# Patient Record
Sex: Male | Born: 1960 | Race: White | Hispanic: No | Marital: Single | Smoking: Current every day smoker
Health system: Southern US, Community
[De-identification: ages and names within clinical notes are randomized; demographics above are authoritative.]

## PROBLEM LIST (undated history)

## (undated) ENCOUNTER — Emergency Department (HOSPITAL_COMMUNITY): Discharge status: Absent without leave | Payer: Medicaid Other

## (undated) DIAGNOSIS — J45909 Unspecified asthma, uncomplicated: Secondary | ICD-10-CM

## (undated) DIAGNOSIS — I10 Essential (primary) hypertension: Secondary | ICD-10-CM

## (undated) DIAGNOSIS — K7460 Unspecified cirrhosis of liver: Secondary | ICD-10-CM

---

## 2020-09-08 ENCOUNTER — Other Ambulatory Visit: Payer: Self-pay

## 2020-09-08 ENCOUNTER — Encounter (HOSPITAL_COMMUNITY): Payer: Self-pay

## 2020-09-08 ENCOUNTER — Ambulatory Visit (HOSPITAL_COMMUNITY)
Admission: EM | Admit: 2020-09-08 | Discharge: 2020-09-08 | Disposition: A | Discharge status: Home / Self Care | Payer: BLUE CROSS/BLUE SHIELD

## 2020-09-08 DIAGNOSIS — Z760 Encounter for issue of repeat prescription: Secondary | ICD-10-CM | POA: Diagnosis not present

## 2020-09-08 HISTORY — DX: Unspecified asthma, uncomplicated: J45.909

## 2020-09-08 HISTORY — DX: Essential (primary) hypertension: I10

## 2020-09-08 MED ORDER — LACTULOSE 10 GM/15ML PO SOLN: 30.0000 g | Freq: Three times a day (TID) | ORAL | 0 refills | Status: AC

## 2020-09-08 MED ORDER — XIFAXAN 550 MG PO TABS: 550.0000 mg | ORAL_TABLET | Freq: Two times a day (BID) | ORAL | 0 refills | Status: AC

## 2020-09-08 NOTE — ED Triage Notes (Addendum)
Pt is traveling from [place] and will be here until first of the year. Pt needs a medication refill on Lactulose and rifaximin.  Pt states that because he has not had these medication he starting to have slurred speech and equilibrium is off.

## 2020-09-08 NOTE — Discharge Instructions (Signed)
As discussed, we did not evaluate for anything else per your preference. Lactulose and xifaxan was refilled for 3 days. Please continue medicine and call your PCP on Monday for further evaluation. If worsening symptoms, go to the emergency department for further evaluation.

## 2020-09-08 NOTE — ED Provider Notes (Signed)
[facility]    CSN: [id number] Arrival date & time: 09/08/20  1525      History   Chief Complaint Chief Complaint  Patient presents with   Medication Refill    Refills need  Lactulose solution and rifaximin      HPI Angel Webster is a 59 y.o. male.   59 year old male comes in for medication refill. States he is from [place], and ran out of his lactulose and rifaximin today. States has slurred speech and equilibrium is off and knows this is how he feels when he isn't taking his medicine. Denies one sided weakness.     Past Medical History:  Diagnosis Date   Asthma    Hypertension     There are no problems to display for this patient.   History reviewed. No pertinent surgical history.     Home Medications    Prior to Admission medications   Medication Sig Start Date End Date Taking? Authorizing Provider  albuterol (VENTOLIN HFA) 108 (90 Base) MCG/ACT inhaler SMARTSIG:2 Puff(s) By Mouth Every 4-6 Hours PRN 07/04/20   [provider]  carvedilol (COREG) 3.125 MG tablet Take 3.125 mg by mouth 2 (two) times daily. 08/30/20   [provider]  CONTOUR NEXT TEST test strip SMARTSIG:Via Meter 07/17/20   [provider]  cyclobenzaprine (FLEXERIL) 10 MG tablet Take by mouth. 04/28/20   [provider]  folic acid (FOLVITE) 1 MG tablet Take 1 mg by mouth daily. 07/01/20   [provider]  furosemide (LASIX) 40 MG tablet Take 40 mg by mouth 2 (two) times daily. 07/04/20   [provider]  ibuprofen (ADVIL) 600 MG tablet Take by mouth. 04/28/20   [provider]  lactulose (CHRONULAC) 10 GM/15ML solution Take 45 mLs (30 g total) by mouth 3 (three) times daily for 3 days. 09/08/20 09/11/20  Tasia Catchings, Maryjean Corpening V, PA-C  lidocaine (LIDODERM) 5 % 1 patch daily. 04/28/20   [provider]  NARCAN 4 MG/0.1ML LIQD nasal spray kit SMARTSIG:1 Spray(s) Both Nares Once PRN 07/26/20   [provider]   pantoprazole (PROTONIX) 40 MG tablet Take 40 mg by mouth 2 (two) times daily. 08/30/20   [provider]  spironolactone (ALDACTONE) 100 MG tablet Take 100 mg by mouth daily. 08/30/20   [provider]  XIFAXAN 550 MG TABS tablet Take 1 tablet (550 mg total) by mouth 2 (two) times daily for 3 days. 09/08/20 09/11/20  Ok Edwards, PA-C    Family History Family History  Family history unknown: Yes    Social History Social History   Tobacco Use   Smoking status: Current Every Day Smoker   Smokeless tobacco: Current User     Allergies   Ace inhibitors   Review of Systems Review of Systems  Reason unable to perform ROS: See HPI as above.     Physical Exam Triage Vital Signs ED Triage Vitals  Enc Vitals Group     BP 09/08/20 1712 110/70     Pulse Rate 09/08/20 1712 74     Resp 09/08/20 1712 18     Temp 09/08/20 1712 97.9 F (36.6 C)     Temp Source 09/08/20 1712 Oral     SpO2 09/08/20 1712 100 %     Weight --      Height --      Head Circumference --      Peak Flow --      Pain Score 09/08/20 1711  0     Pain Loc --      Pain Edu? --      Excl. in [place]? --    No data found.  Updated Vital Signs BP 110/70 (BP Location: Right Arm)    Pulse 74    Temp 97.9 F (36.6 C) (Oral)    Resp 18    SpO2 100%   Visual Acuity Right Eye Distance:   Left Eye Distance:   Bilateral Distance:    Right Eye Near:   Left Eye Near:    Bilateral Near:     Physical Exam Constitutional:      General: He is not in acute distress.    Appearance: Normal appearance. He is well-developed. He is not toxic-appearing or diaphoretic.  HENT:     Head: Normocephalic and atraumatic.  Eyes:     Conjunctiva/sclera: Conjunctivae normal.     Pupils: Pupils are equal, round, and reactive to light.  Pulmonary:     Effort: Pulmonary effort is normal. No respiratory distress.  Musculoskeletal:     Cervical back: Normal range of motion and neck supple.  Skin:    General: Skin  is warm and dry.  Neurological:     Mental Status: He is alert and oriented to person, place, and time.     Comments: No obvious focal deficits. Strength 5/5 bilaterally. Able to ambulate on own without difficulty.      UC Treatments / Results  Labs (all labs ordered are listed, but only abnormal results are displayed) Labs Reviewed - No data to display  EKG   Radiology No results found.  Procedures Procedures (including critical care time)  Medications Ordered in UC Medications - No data to display  Initial Impression / Assessment and Plan / UC Course  I have reviewed the triage vital signs and the nursing notes.  Pertinent labs & imaging results that were available during my care of the patient were reviewed by me and considered in my medical decision making (see chart for details).    Discussed worries of symptoms and requiring further evaluation. Patient states he did not want further evaluation and declined higher level of care. States he just wants refill of medicine until weekday when he can call his PCP. He expressed understanding that there may be acute processes that can be life threatening, and would just like refill of medicine. Medicines refilled for 3 days, to call PCP for further management. To proceed to ED if symptoms worsening.  Final Clinical Impressions(s) / UC Diagnoses   Final diagnoses:  Encounter for medication refill   ED Prescriptions    Medication Sig Dispense Auth. Provider   XIFAXAN 550 MG TABS tablet Take 1 tablet (550 mg total) by mouth 2 (two) times daily for 3 days. 6 tablet Acheron Sugg V, PA-C   lactulose (CHRONULAC) 10 GM/15ML solution Take 45 mLs (30 g total) by mouth 3 (three) times daily for 3 days. 405 mL Ok Edwards, PA-C     PDMP not reviewed this encounter.   Ok Edwards, PA-C 09/08/20 1938

## 2020-09-15 ENCOUNTER — Ambulatory Visit (HOSPITAL_COMMUNITY)
Admission: EM | Admit: 2020-09-15 | Discharge: 2020-09-15 | Disposition: A | Discharge status: Home / Self Care | Payer: BLUE CROSS/BLUE SHIELD | Attending: Family Medicine | Admitting: Family Medicine

## 2020-09-15 ENCOUNTER — Encounter (HOSPITAL_COMMUNITY): Payer: Self-pay | Admitting: *Deleted

## 2020-09-15 ENCOUNTER — Other Ambulatory Visit: Payer: Self-pay

## 2020-09-15 DIAGNOSIS — K769 Liver disease, unspecified: Secondary | ICD-10-CM

## 2020-09-15 MED ORDER — LACTULOSE 10 GM/15ML PO SOLN: 30.0000 g | Freq: Three times a day (TID) | ORAL | 0 refills | Status: AC

## 2020-09-15 NOTE — ED Provider Notes (Signed)
[facility]    CSN: [id number] Arrival date & time: 09/15/20  1001      History   Chief Complaint Chief Complaint  Patient presents with   Medication Refill    Pt needs Lactulose  refill     HPI Angel Webster is a 59 y.o. male.   Here today requesting refill on his lactulose. States he lives in [place] and has been having a hard time getting his medication filled down here while he's visiting. Just ran out this morning, has been compliant and tolerating it well. Denies altered mental status, weakness, falls, abdominal pain, diarrhea, or other issues currently. Last labs per patient were last month back in [place] and per his report they were stable.      Past Medical History:  Diagnosis Date   Asthma    Hypertension     There are no problems to display for this patient.   History reviewed. No pertinent surgical history.     Home Medications    Prior to Admission medications   Medication Sig Start Date End Date Taking? Authorizing Provider  albuterol (VENTOLIN HFA) 108 (90 Base) MCG/ACT inhaler SMARTSIG:2 Puff(s) By Mouth Every 4-6 Hours PRN 07/04/20   [provider]  carvedilol (COREG) 3.125 MG tablet Take 3.125 mg by mouth 2 (two) times daily. 08/30/20   [provider]  CONTOUR NEXT TEST test strip SMARTSIG:Via Meter 07/17/20   [provider]  cyclobenzaprine (FLEXERIL) 10 MG tablet Take by mouth. 04/28/20   [provider]  folic acid (FOLVITE) 1 MG tablet Take 1 mg by mouth daily. 07/01/20   [provider]  furosemide (LASIX) 40 MG tablet Take 40 mg by mouth 2 (two) times daily. 07/04/20   [provider]  ibuprofen (ADVIL) 600 MG tablet Take by mouth. 04/28/20   [provider]  lactulose (CHRONULAC) 10 GM/15ML solution Take 45 mLs (30 g total) by mouth 3 (three) times daily for 3 days. 09/15/20 09/18/20  Particia Nearing, PA-C  lidocaine (LIDODERM) 5 % 1 patch daily. 04/28/20    [provider]  NARCAN 4 MG/0.1ML LIQD nasal spray kit SMARTSIG:1 Spray(s) Both Nares Once PRN 07/26/20   [provider]  pantoprazole (PROTONIX) 40 MG tablet Take 40 mg by mouth 2 (two) times daily. 08/30/20   [provider]  spironolactone (ALDACTONE) 100 MG tablet Take 100 mg by mouth daily. 08/30/20   [provider]    Family History Family History  Family history unknown: Yes    Social History Social History   Tobacco Use   Smoking status: Current Every Day Smoker   Smokeless tobacco: Current User  Substance Use Topics   Alcohol use: Not Currently   Drug use: Not Currently     Allergies   Ace inhibitors   Review of Systems Review of Systems PER HPI    Physical Exam Triage Vital Signs ED Triage Vitals  Enc Vitals Group     BP 09/15/20 1012 127/90     Pulse Rate 09/15/20 1012 79     Resp 09/15/20 1012 16     Temp 09/15/20 1012 97.8 F (36.6 C)     Temp Source 09/15/20 1012 Oral     SpO2 09/15/20 1012 100 %     Weight --      Height --      Head Circumference --      Peak Flow --      Pain Score 09/15/20 1014 0  Pain Loc --      Pain Edu? --      Excl. in [place]? --    No data found.  Updated Vital Signs BP 127/90 (BP Location: Right Arm)    Pulse 79    Temp 97.8 F (36.6 C) (Oral)    Resp 16    SpO2 100%   Visual Acuity Right Eye Distance:   Left Eye Distance:   Bilateral Distance:    Right Eye Near:   Left Eye Near:    Bilateral Near:     Physical Exam Vitals and nursing note reviewed.  Constitutional:      Appearance: Normal appearance.  HENT:     Head: Atraumatic.     Right Ear: Tympanic membrane normal.     Left Ear: Tympanic membrane normal.     Nose: Nose normal.     Mouth/Throat:     Mouth: Mucous membranes are moist.     Pharynx: Oropharynx is clear.  Eyes:     Extraocular Movements: Extraocular movements intact.     Conjunctiva/sclera: Conjunctivae normal.  Cardiovascular:     Rate  and Rhythm: Normal rate and regular rhythm.     Heart sounds: Normal heart sounds.  Pulmonary:     Effort: Pulmonary effort is normal.     Breath sounds: Normal breath sounds.  Abdominal:     General: Bowel sounds are normal. There is no distension.     Palpations: Abdomen is soft.     Tenderness: There is no abdominal tenderness. There is no guarding.  Musculoskeletal:        General: Normal range of motion.     Cervical back: Normal range of motion and neck supple.  Skin:    General: Skin is warm and dry.  Neurological:     General: No focal deficit present.     Mental Status: He is oriented to person, place, and time.  Psychiatric:        Mood and Affect: Mood normal.        Thought Content: Thought content normal.        Judgment: Judgment normal.      UC Treatments / Results  Labs (all labs ordered are listed, but only abnormal results are displayed) Labs Reviewed - No data to display  EKG   Radiology No results found.  Procedures Procedures (including critical care time)  Medications Ordered in UC Medications - No data to display  Initial Impression / Assessment and Plan / UC Course  I have reviewed the triage vital signs and the nursing notes.  Pertinent labs & imaging results that were available during my care of the patient were reviewed by me and considered in my medical decision making (see chart for details).     Declines labs today, as he states PCP just did them last month. Will refill x 3 days per his request as his home medicines should be here Tuesday. Discussed re-evaluation if having any sxs in meantime.   Final Clinical Impressions(s) / UC Diagnoses   Final diagnoses:  Chronic liver disease   Discharge Instructions   None    ED Prescriptions    Medication Sig Dispense Auth. Provider   lactulose (CHRONULAC) 10 GM/15ML solution Take 45 mLs (30 g total) by mouth 3 (three) times daily for 3 days. 405 mL Volney American, [place]      PDMP not reviewed this encounter.   Volney American, [place] 09/15/20 1047

## 2020-09-15 NOTE — ED Triage Notes (Signed)
Pt needs a refill on lactulose

## 2020-10-24 ENCOUNTER — Encounter (INDEPENDENT_AMBULATORY_CARE_PROVIDER_SITE_OTHER): Payer: Self-pay | Admitting: Primary Care

## 2020-10-24 ENCOUNTER — Other Ambulatory Visit: Payer: Self-pay

## 2020-10-24 ENCOUNTER — Ambulatory Visit (INDEPENDENT_AMBULATORY_CARE_PROVIDER_SITE_OTHER): Payer: Medicaid Other | Admitting: Primary Care

## 2020-10-24 VITALS — BP 122/85 | HR 95 | Temp 97.5°F | Ht 69.0 in | Wt 323.2 lb

## 2020-10-24 DIAGNOSIS — R601 Generalized edema: Secondary | ICD-10-CM | POA: Diagnosis not present

## 2020-10-24 DIAGNOSIS — K7030 Alcoholic cirrhosis of liver without ascites: Secondary | ICD-10-CM | POA: Diagnosis not present

## 2020-10-24 DIAGNOSIS — Z125 Encounter for screening for malignant neoplasm of prostate: Secondary | ICD-10-CM

## 2020-10-24 DIAGNOSIS — Z7689 Persons encountering health services in other specified circumstances: Secondary | ICD-10-CM | POA: Diagnosis not present

## 2020-10-24 DIAGNOSIS — I10 Essential (primary) hypertension: Secondary | ICD-10-CM | POA: Diagnosis not present

## 2020-10-24 DIAGNOSIS — K219 Gastro-esophageal reflux disease without esophagitis: Secondary | ICD-10-CM

## 2020-10-24 DIAGNOSIS — Z13220 Encounter for screening for lipoid disorders: Secondary | ICD-10-CM

## 2020-10-24 DIAGNOSIS — L80 Vitiligo: Secondary | ICD-10-CM

## 2020-10-24 MED ORDER — XIFAXAN 550 MG PO TABS: 550.0000 mg | ORAL_TABLET | Freq: Two times a day (BID) | ORAL | 1 refills | Status: DC

## 2020-10-24 MED ORDER — FUROSEMIDE 40 MG PO TABS: 40.0000 mg | ORAL_TABLET | Freq: Two times a day (BID) | ORAL | 1 refills | Status: DC

## 2020-10-24 MED ORDER — CARVEDILOL 3.125 MG PO TABS: 3.1250 mg | ORAL_TABLET | Freq: Two times a day (BID) | ORAL | 1 refills | Status: DC

## 2020-10-24 MED ORDER — SPIRONOLACTONE 100 MG PO TABS: 100.0000 mg | ORAL_TABLET | Freq: Every day | ORAL | 1 refills | Status: DC

## 2020-10-24 MED ORDER — PANTOPRAZOLE SODIUM 40 MG PO TBEC
40.0000 mg | DELAYED_RELEASE_TABLET | Freq: Two times a day (BID) | ORAL | 1 refills | Status: DC
Start: 2020-10-24 — End: 2020-11-14

## 2020-10-24 MED ORDER — LACTULOSE 10 GM/15ML PO SOLN: ORAL | 3 refills | Status: DC

## 2020-10-24 MED ORDER — ZINC SULFATE 220 (50 ZN) MG PO CAPS: 220.0000 mg | ORAL_CAPSULE | Freq: Every day | ORAL | 1 refills | Status: DC

## 2020-10-24 NOTE — Progress Notes (Signed)
New Patient Office Visit  Subjective:  Patient ID: Angel Webster, male    DOB: 1960/11/28  Age: 60 y.o. MRN: [id number]  CC:  Chief Complaint  Patient presents with  . New Patient (Initial Visit)    HPI [name] is a 60 year male who presents for 2 Urgent care visit  09/08/20-refills need Lactulose solution and rifaximin return to urgent care on 09/15/20 Lactulose solution and rifaximin. Establishing care with new provider.    Past Medical History:  Diagnosis Date  . Asthma   . Hypertension     No past surgical history on file.  Family History  Family history unknown: Yes    Social History   Socioeconomic History  . Marital status: Single    Spouse name: Not on file  . Number of children: Not on file  . Years of education: Not on file  . Highest education level: Not on file  Occupational History  . Not on file  Tobacco Use  . Smoking status: Current Every Day Smoker  . Smokeless tobacco: Current User  Substance and Sexual Activity  . Alcohol use: Not Currently  . Drug use: Not Currently  . Sexual activity: Not Currently  Other Topics Concern  . Not on file  Social History Narrative  . Not on file   Social Determinants of Health   Financial Resource Strain: Not on file  Food Insecurity: Not on file  Transportation Needs: Not on file  Physical Activity: Not on file  Stress: Not on file  Social Connections: Not on file  Intimate Partner Violence: Not on file    ROS Review of Systems  Skin:       vitiligo  Red areas appeared 1 week ago under eyes , outline of beard and under bottom lip- flaky   All other systems reviewed and are negative.   Objective:   Today's Vitals: BP 122/85 (BP Location: Right Arm, Patient Position: Sitting, Cuff Size: Large)   Pulse 95   Temp (!) 97.5 F (36.4 C) (Temporal)   Ht _0  (1.753 m)   Wt (!) 323 lb 3.2 oz (146.6 kg)   SpO2 95%   BMI 47.73 kg/m   Physical Exam Vitals reviewed.  Constitutional:       Appearance: Normal appearance. He is obese.  HENT:     Head: Normocephalic.     Right Ear: Tympanic membrane and external ear normal.     Left Ear: Tympanic membrane and external ear normal.     Nose: Nose normal.  Eyes:     Extraocular Movements: Extraocular movements intact.     Pupils: Pupils are equal, round, and reactive to light.  Cardiovascular:     Rate and Rhythm: Normal rate and regular rhythm.  Pulmonary:     Effort: Pulmonary effort is normal.     Breath sounds: Normal breath sounds.  Abdominal:     General: Bowel sounds are normal.     Palpations: Abdomen is soft.  Musculoskeletal:        General: Normal range of motion.     Cervical back: Normal range of motion and neck supple.  Skin:    General: Skin is warm and dry.  Neurological:     Mental Status: He is alert and oriented to person, place, and time.  Psychiatric:        Mood and Affect: Mood normal.        Behavior: Behavior normal.  Thought Content: Thought content normal.        Judgment: Judgment normal.     Assessment & Plan:  Notnamed was seen today for new patient (initial visit).  Diagnoses and all orders for this visit:  Encounter to establish care  Alcoholic cirrhosis of liver without ascites (Zap) -     spironolactone (ALDACTONE) 100 MG tablet; Take 1 tablet (100 mg total) by mouth daily. -     furosemide (LASIX) 40 MG tablet; Take 1 tablet (40 mg total) by mouth 2 (two) times daily.   -   lactulose (CHRONULAC) 10 GM/15ML solution; take 30 milliliters by mouth three times a day   Essential hypertension -    Generalized edema -     spironolactone (ALDACTONE) 100 MG tablet; Take 1 tablet (100 mg total) by mouth daily. -     furosemide (LASIX) 40 MG tablet; Take 1 tablet (40 mg total) by mouth 2 (two) times daily.  Gastroesophageal reflux disease without esophagitis -     pantoprazole (PROTONIX) 40 MG tablet; Take 1 tablet (40 mg total) by mouth 2 (two) times  daily.  Vitiligo With recent changes red scaly skin cheeks, under lip and beard area Refer to dermatology. Patient currently using witch hazel  Prostate cancer screening PSA-future   Other orders -     carvedilol (COREG) 3.125 MG tablet; Take 1 tablet (3.125 mg total) by mouth 2 (two) times daily. -     zinc sulfate 220 (50 Zn) MG capsule; Take 1 capsule (220 mg total) by mouth daily.    Outpatient Encounter Medications as of 10/24/2020  Medication Sig  . albuterol (VENTOLIN HFA) 108 (90 Base) MCG/ACT inhaler SMARTSIG:2 Puff(s) By Mouth Every 4-6 Hours PRN  . carvedilol (COREG) 3.125 MG tablet Take 3.125 mg by mouth 2 (two) times daily.  . CONTOUR NEXT TEST test strip SMARTSIG:Via Meter  . cyclobenzaprine (FLEXERIL) 10 MG tablet Take by mouth.  . folic acid (FOLVITE) 1 MG tablet Take 1 mg by mouth daily.  . furosemide (LASIX) 40 MG tablet Take 40 mg by mouth 2 (two) times daily.  Marland Kitchen ibuprofen (ADVIL) 600 MG tablet Take by mouth.  . lactulose (CHRONULAC) 10 GM/15ML solution take 30 milliliters by mouth three times a day  . lidocaine (LIDODERM) 5 % 1 patch daily.  Marland Kitchen NARCAN 4 MG/0.1ML LIQD nasal spray kit SMARTSIG:1 Spray(s) Both Nares Once PRN  . pantoprazole (PROTONIX) 40 MG tablet Take 40 mg by mouth 2 (two) times daily.  Marland Kitchen spironolactone (ALDACTONE) 100 MG tablet Take 100 mg by mouth daily.  Marland Kitchen XIFAXAN 550 MG TABS tablet Take 550 mg by mouth 2 (two) times daily.  Marland Kitchen zinc sulfate 220 (50 Zn) MG capsule Take by mouth daily.   No facility-administered encounter medications on file as of 10/24/2020.    Follow-up: No follow-ups on file.   Kerin Perna, NP

## 2020-10-26 ENCOUNTER — Other Ambulatory Visit (INDEPENDENT_AMBULATORY_CARE_PROVIDER_SITE_OTHER): Payer: Medicaid Other

## 2020-10-26 ENCOUNTER — Other Ambulatory Visit: Payer: Self-pay

## 2020-10-26 DIAGNOSIS — Z125 Encounter for screening for malignant neoplasm of prostate: Secondary | ICD-10-CM

## 2020-10-26 DIAGNOSIS — Z13220 Encounter for screening for lipoid disorders: Secondary | ICD-10-CM

## 2020-10-26 DIAGNOSIS — K7030 Alcoholic cirrhosis of liver without ascites: Secondary | ICD-10-CM

## 2020-10-27 LAB — CMP14+EGFR
ALT: 69 IU/L — ABNORMAL HIGH (ref 0–44)
AST: 114 IU/L — ABNORMAL HIGH (ref 0–40)
Albumin/Globulin Ratio: 0.7 — ABNORMAL LOW (ref 1.2–2.2)
Albumin: 2.9 g/dL — ABNORMAL LOW (ref 3.8–4.9)
Alkaline Phosphatase: 317 IU/L — ABNORMAL HIGH (ref 44–121)
BUN/Creatinine Ratio: 15 (ref 9–20)
BUN: 15 mg/dL (ref 6–24)
Bilirubin Total: 3.7 mg/dL — ABNORMAL HIGH (ref 0.0–1.2)
CO2: 23 mmol/L (ref 20–29)
Calcium: 9.4 mg/dL (ref 8.7–10.2)
Chloride: 99 mmol/L (ref 96–106)
Creatinine, Ser: 1.01 mg/dL (ref 0.76–1.27)
GFR calc Af Amer: 94 mL/min/{1.73_m2} (ref >59)
GFR calc non Af Amer: 82 mL/min/{1.73_m2} (ref >59)
Globulin, Total: 4.2 g/dL (ref 1.5–4.5)
Glucose: 100 mg/dL — ABNORMAL HIGH (ref 65–99)
Potassium: 4.3 mmol/L (ref 3.5–5.2)
Sodium: 133 mmol/L — ABNORMAL LOW (ref 134–144)
Total Protein: 7.1 g/dL (ref 6.0–8.5)

## 2020-10-27 LAB — CBC WITH DIFFERENTIAL/PLATELET
Basophils Absolute: 0.1 10*3/uL (ref 0.0–0.2)
Basos: 1 %
EOS (ABSOLUTE): 0.3 10*3/uL (ref 0.0–0.4)
Eos: 4 %
Hematocrit: 38.8 % (ref 37.5–51.0)
Hemoglobin: 14.1 g/dL (ref 13.0–17.7)
Immature Grans (Abs): 0 10*3/uL (ref 0.0–0.1)
Immature Granulocytes: 0 %
Lymphocytes Absolute: 2.1 10*3/uL (ref 0.7–3.1)
Lymphs: 27 %
MCH: 33.9 pg — ABNORMAL HIGH (ref 26.6–33.0)
MCHC: 36.3 g/dL — ABNORMAL HIGH (ref 31.5–35.7)
MCV: 93 fL (ref 79–97)
Monocytes Absolute: 0.8 10*3/uL (ref 0.1–0.9)
Monocytes: 10 %
Neutrophils Absolute: 4.4 10*3/uL (ref 1.4–7.0)
Neutrophils: 58 %
Platelets: 88 10*3/uL — CL (ref 150–450)
RBC: 4.16 x10E6/uL (ref 4.14–5.80)
RDW: 15 % (ref 11.6–15.4)
WBC: 7.6 10*3/uL (ref 3.4–10.8)

## 2020-10-27 LAB — LIPID PANEL
Chol/HDL Ratio: 4.9 ratio (ref 0.0–5.0)
Cholesterol, Total: 195 mg/dL (ref 100–199)
HDL: 40 mg/dL (ref >39)
LDL Chol Calc (NIH): 118 mg/dL — ABNORMAL HIGH (ref 0–99)
Triglycerides: 208 mg/dL — ABNORMAL HIGH (ref 0–149)
VLDL Cholesterol Cal: 37 mg/dL (ref 5–40)

## 2020-10-27 LAB — PSA: Prostate Specific Ag, Serum: <0.1 ng/mL (ref 0.0–4.0)

## 2020-11-01 ENCOUNTER — Inpatient Hospital Stay (HOSPITAL_COMMUNITY)
Admission: EM | Admit: 2020-11-01 | Discharge: 2020-11-02 | DRG: 434 | Disposition: A | Discharge status: Home / Self Care | Payer: Medicaid Other | Attending: Family Medicine | Admitting: Family Medicine

## 2020-11-01 ENCOUNTER — Other Ambulatory Visit: Payer: Self-pay

## 2020-11-01 ENCOUNTER — Emergency Department (HOSPITAL_COMMUNITY): Payer: Medicaid Other

## 2020-11-01 DIAGNOSIS — K7030 Alcoholic cirrhosis of liver without ascites: Secondary | ICD-10-CM | POA: Diagnosis not present

## 2020-11-01 DIAGNOSIS — R0902 Hypoxemia: Secondary | ICD-10-CM | POA: Diagnosis not present

## 2020-11-01 DIAGNOSIS — D6959 Other secondary thrombocytopenia: Secondary | ICD-10-CM | POA: Diagnosis present

## 2020-11-01 DIAGNOSIS — K7040 Alcoholic hepatic failure without coma: Secondary | ICD-10-CM | POA: Diagnosis present

## 2020-11-01 DIAGNOSIS — E669 Obesity, unspecified: Secondary | ICD-10-CM | POA: Diagnosis present

## 2020-11-01 DIAGNOSIS — K219 Gastro-esophageal reflux disease without esophagitis: Secondary | ICD-10-CM | POA: Diagnosis present

## 2020-11-01 DIAGNOSIS — D696 Thrombocytopenia, unspecified: Secondary | ICD-10-CM | POA: Diagnosis not present

## 2020-11-01 DIAGNOSIS — I10 Essential (primary) hypertension: Secondary | ICD-10-CM | POA: Diagnosis not present

## 2020-11-01 DIAGNOSIS — Z20822 Contact with and (suspected) exposure to covid-19: Secondary | ICD-10-CM | POA: Diagnosis present

## 2020-11-01 DIAGNOSIS — K7290 Hepatic failure, unspecified without coma: Secondary | ICD-10-CM | POA: Diagnosis not present

## 2020-11-01 DIAGNOSIS — L80 Vitiligo: Secondary | ICD-10-CM | POA: Diagnosis present

## 2020-11-01 DIAGNOSIS — Z79899 Other long term (current) drug therapy: Secondary | ICD-10-CM

## 2020-11-01 DIAGNOSIS — K7682 Hepatic encephalopathy: Secondary | ICD-10-CM

## 2020-11-01 DIAGNOSIS — R4781 Slurred speech: Secondary | ICD-10-CM | POA: Diagnosis not present

## 2020-11-01 DIAGNOSIS — F1010 Alcohol abuse, uncomplicated: Secondary | ICD-10-CM | POA: Diagnosis present

## 2020-11-01 DIAGNOSIS — F17200 Nicotine dependence, unspecified, uncomplicated: Secondary | ICD-10-CM | POA: Diagnosis present

## 2020-11-01 DIAGNOSIS — R4182 Altered mental status, unspecified: Secondary | ICD-10-CM | POA: Diagnosis not present

## 2020-11-01 DIAGNOSIS — J45909 Unspecified asthma, uncomplicated: Secondary | ICD-10-CM | POA: Diagnosis present

## 2020-11-01 DIAGNOSIS — R404 Transient alteration of awareness: Secondary | ICD-10-CM | POA: Diagnosis not present

## 2020-11-01 LAB — RAPID URINE DRUG SCREEN, HOSP PERFORMED
Amphetamines: NOT DETECTED
Barbiturates: NOT DETECTED
Benzodiazepines: NOT DETECTED
Cocaine: NOT DETECTED
Opiates: NOT DETECTED
Tetrahydrocannabinol: NOT DETECTED

## 2020-11-01 LAB — CBC WITH DIFFERENTIAL/PLATELET
Abs Immature Granulocytes: 0.02 10*3/uL (ref 0.00–0.07)
Basophils Absolute: 0 10*3/uL (ref 0.0–0.1)
Basophils Relative: 1 %
Eosinophils Absolute: 0.2 10*3/uL (ref 0.0–0.5)
Eosinophils Relative: 3 %
HCT: 40.1 % (ref 39.0–52.0)
Hemoglobin: 14.4 g/dL (ref 13.0–17.0)
Immature Granulocytes: 0 %
Lymphocytes Relative: 27 %
Lymphs Abs: 1.8 10*3/uL (ref 0.7–4.0)
MCH: 34.8 pg — ABNORMAL HIGH (ref 26.0–34.0)
MCHC: 35.9 g/dL (ref 30.0–36.0)
MCV: 96.9 fL (ref 80.0–100.0)
Monocytes Absolute: 0.7 10*3/uL (ref 0.1–1.0)
Monocytes Relative: 11 %
Neutro Abs: 3.8 10*3/uL (ref 1.7–7.7)
Neutrophils Relative %: 58 %
Platelets: 62 10*3/uL — ABNORMAL LOW (ref 150–400)
RBC: 4.14 MIL/uL — ABNORMAL LOW (ref 4.22–5.81)
RDW: 15.8 % — ABNORMAL HIGH (ref 11.5–15.5)
WBC: 6.5 10*3/uL (ref 4.0–10.5)
nRBC: 0 % (ref 0.0–0.2)

## 2020-11-01 LAB — URINALYSIS, ROUTINE W REFLEX MICROSCOPIC
Bilirubin Urine: NEGATIVE
Glucose, UA: NEGATIVE mg/dL
Hgb urine dipstick: NEGATIVE
Ketones, ur: NEGATIVE mg/dL
Leukocytes,Ua: NEGATIVE
Nitrite: NEGATIVE
Protein, ur: NEGATIVE mg/dL
Specific Gravity, Urine: 1.02 (ref 1.005–1.030)
pH: 7 (ref 5.0–8.0)

## 2020-11-01 LAB — COMPREHENSIVE METABOLIC PANEL
ALT: 64 U/L — ABNORMAL HIGH (ref 0–44)
AST: 118 U/L — ABNORMAL HIGH (ref 15–41)
Albumin: 2.4 g/dL — ABNORMAL LOW (ref 3.5–5.0)
Alkaline Phosphatase: 199 U/L — ABNORMAL HIGH (ref 38–126)
Anion gap: 7 (ref 5–15)
BUN: 12 mg/dL (ref 6–20)
CO2: 24 mmol/L (ref 22–32)
Calcium: 8.5 mg/dL — ABNORMAL LOW (ref 8.9–10.3)
Chloride: 104 mmol/L (ref 98–111)
Creatinine, Ser: 0.82 mg/dL (ref 0.61–1.24)
GFR, Estimated: >60 mL/min (ref >60)
Glucose, Bld: 135 mg/dL — ABNORMAL HIGH (ref 70–99)
Potassium: 4.5 mmol/L (ref 3.5–5.1)
Sodium: 135 mmol/L (ref 135–145)
Total Bilirubin: 4.1 mg/dL — ABNORMAL HIGH (ref 0.3–1.2)
Total Protein: 6.8 g/dL (ref 6.5–8.1)

## 2020-11-01 LAB — AMMONIA: Ammonia: 192 umol/L — ABNORMAL HIGH (ref 9–35)

## 2020-11-01 LAB — ETHANOL: Alcohol, Ethyl (B): <10 mg/dL (ref <10)

## 2020-11-01 MED ORDER — PANTOPRAZOLE SODIUM 40 MG IV SOLR
40.0000 mg | Freq: Two times a day (BID) | INTRAVENOUS | Status: DC
  Administered 2020-11-02 (×2): 40 mg via INTRAVENOUS
  Filled 2020-11-01 (×2): qty 40

## 2020-11-01 MED ORDER — FOLIC ACID 1 MG PO TABS
1.0000 mg | ORAL_TABLET | Freq: Every day | ORAL | Status: DC
  Administered 2020-11-02: 1 mg via ORAL
  Filled 2020-11-01: qty 1

## 2020-11-01 MED ORDER — FUROSEMIDE 10 MG/ML IJ SOLN
20.0000 mg | Freq: Two times a day (BID) | INTRAMUSCULAR | Status: DC
  Administered 2020-11-02: 20 mg via INTRAVENOUS
  Filled 2020-11-01: qty 2

## 2020-11-01 MED ORDER — SPIRONOLACTONE 25 MG PO TABS
100.0000 mg | ORAL_TABLET | Freq: Every day | ORAL | Status: DC
  Administered 2020-11-02: 100 mg via ORAL
  Filled 2020-11-01: qty 1

## 2020-11-01 MED ORDER — ZINC SULFATE 220 (50 ZN) MG PO CAPS
220.0000 mg | ORAL_CAPSULE | Freq: Every day | ORAL | Status: DC
Start: 2020-11-02 — End: 2020-11-03
  Administered 2020-11-02: 220 mg via ORAL
  Filled 2020-11-01: qty 1

## 2020-11-01 MED ORDER — THIAMINE HCL 100 MG/ML IJ SOLN
100.0000 mg | Freq: Every day | INTRAMUSCULAR | Status: DC
  Administered 2020-11-02 (×2): 100 mg via INTRAVENOUS
  Filled 2020-11-01 (×2): qty 2

## 2020-11-01 MED ORDER — ENOXAPARIN SODIUM 80 MG/0.8ML ~~LOC~~ SOLN
70.0000 mg | SUBCUTANEOUS | Status: DC
  Administered 2020-11-02: 70 mg via SUBCUTANEOUS
  Filled 2020-11-01 (×2): qty 0.7

## 2020-11-01 MED ORDER — RIFAXIMIN 550 MG PO TABS
550.0000 mg | ORAL_TABLET | Freq: Two times a day (BID) | ORAL | Status: DC
  Administered 2020-11-02 (×2): 550 mg via ORAL
  Filled 2020-11-01 (×3): qty 1

## 2020-11-01 MED ORDER — LACTULOSE 10 GM/15ML PO SOLN
20.0000 g | Freq: Three times a day (TID) | ORAL | Status: DC
  Administered 2020-11-02 (×3): 20 g via ORAL
  Filled 2020-11-01 (×5): qty 30

## 2020-11-01 NOTE — ED Triage Notes (Signed)
Pt BIB [facility] for AMS. Pt cousin reports pt forgetfullness and some confusion since yesterday. Pt able to state name/DOB and current year on arrival. Denies HA, pain or any symptoms at this time. Pt ambulatory with shuffled gait.

## 2020-11-01 NOTE — ED Provider Notes (Signed)
[facility] Provider Note   CSN: [id number] Arrival date & time: 11/01/20  1347  LEVEL 5 CAVEAT - ALTERED MENTAL STATUS  History Chief Complaint  Patient presents with  . Altered Mental Status    Kristen Bushway is a 60 y.o. male.  HPI 60 year old male presents with altered mental status.  History is from nursing staff who spoke to EMS and partially from the patient but it is difficult because he is confused.  Patient reportedly is altered from baseline according to the cousin he lives with.  Patient tells me that he knows he is in the hospital but not sure what city, what the date is, or what his birthday is.  He states he is here because he is "shaky".  He denies illness or pain.   Past Medical History:  Diagnosis Date  . Asthma   . Hypertension     There are no problems to display for this patient.   No past surgical history on file.     Family History  Family history unknown: Yes    Social History   Tobacco Use  . Smoking status: Current Every Day Smoker  . Smokeless tobacco: Current User  Substance Use Topics  . Alcohol use: Not Currently  . Drug use: Not Currently    Home Medications Prior to Admission medications   Medication Sig Start Date End Date Taking? Authorizing Provider  albuterol (VENTOLIN HFA) 108 (90 Base) MCG/ACT inhaler SMARTSIG:2 Puff(s) By Mouth Every 4-6 Hours PRN 07/04/20   [provider]  carvedilol (COREG) 3.125 MG tablet Take 1 tablet (3.125 mg total) by mouth 2 (two) times daily. 10/24/20   Kerin Perna, NP  CONTOUR NEXT TEST test strip SMARTSIG:Via Meter 07/17/20   [provider]  cyclobenzaprine (FLEXERIL) 10 MG tablet Take by mouth. 04/28/20   [provider]  folic acid (FOLVITE) 1 MG tablet Take 1 mg by mouth daily. 07/01/20   [provider]  furosemide (LASIX) 40 MG tablet Take 1 tablet (40 mg total) by mouth 2 (two) times daily. 10/24/20   Kerin Perna, NP  ibuprofen (ADVIL) 600 MG tablet Take by mouth. 04/28/20   [provider]  lactulose (CHRONULAC) 10 GM/15ML solution take 30 milliliters by mouth three times a day 10/24/20   Kerin Perna, NP  lidocaine (LIDODERM) 5 % 1 patch daily. 04/28/20   [provider]  NARCAN 4 MG/0.1ML LIQD nasal spray kit SMARTSIG:1 Spray(s) Both Nares Once PRN 07/26/20   [provider]  pantoprazole (PROTONIX) 40 MG tablet Take 1 tablet (40 mg total) by mouth 2 (two) times daily. 10/24/20   Kerin Perna, NP  spironolactone (ALDACTONE) 100 MG tablet Take 1 tablet (100 mg total) by mouth daily. 10/24/20   Kerin Perna, NP  XIFAXAN 550 MG TABS tablet Take 1 tablet (550 mg total) by mouth 2 (two) times daily. 10/24/20   Kerin Perna, NP  zinc sulfate 220 (50 Zn) MG capsule Take 1 capsule (220 mg total) by mouth daily. 10/24/20   Kerin Perna, NP    Allergies    Ace inhibitors  Review of Systems   Review of Systems  Unable to perform ROS: Mental status change    Physical Exam Updated Vital Signs BP 109/65 (BP Location: Right Arm)   Pulse 71   Temp (!) 97.4 F (36.3 C) (Oral)   Resp 14   SpO2 100%   Physical Exam Vitals and  nursing note reviewed.  Constitutional:      Appearance: He is well-developed and well-nourished. He is obese.  HENT:     Head: Normocephalic and atraumatic.     Right Ear: External ear normal.     Left Ear: External ear normal.     Nose: Nose normal.  Eyes:     General:        Right eye: No discharge.        Left eye: No discharge.     Extraocular Movements: Extraocular movements intact.     Pupils: Pupils are equal, round, and reactive to light.  Cardiovascular:     Rate and Rhythm: Normal rate and regular rhythm.     Heart sounds: Normal heart sounds.  Pulmonary:     Effort: Pulmonary effort is normal.     Breath sounds: Normal breath sounds.  Abdominal:     Palpations: Abdomen is soft.      Tenderness: There is no abdominal tenderness.  Musculoskeletal:        General: No edema.     Cervical back: Neck supple.  Skin:    General: Skin is warm and dry.     Comments: Vitiligo   Neurological:     Mental Status: He is alert.     Comments: CN 3-12 grossly intact. 5/5 strength in all 4 extremities. Grossly normal sensation. However on finger to nose he puts finger to nose but doesn't move to touch my finger, despite coaching in different ways  Psychiatric:        Mood and Affect: Mood is not anxious.     ED Results / Procedures / Treatments   Labs (all labs ordered are listed, but only abnormal results are displayed) Labs Reviewed  COMPREHENSIVE METABOLIC PANEL  ETHANOL  CBC WITH DIFFERENTIAL/PLATELET  URINALYSIS, ROUTINE W REFLEX MICROSCOPIC  RAPID URINE DRUG SCREEN, HOSP PERFORMED  AMMONIA  CBG MONITORING, ED    EKG None  Radiology CT Head Wo Contrast  Result Date: 11/01/2020 CLINICAL DATA:  Forgetfulness and confusion since yesterday, altered mental status EXAM: CT HEAD WITHOUT CONTRAST TECHNIQUE: Contiguous axial images were obtained from the base of the skull through the vertex without intravenous contrast. COMPARISON:  None. FINDINGS: Brain: No evidence of acute infarction, hemorrhage, hydrocephalus, extra-axial collection or mass lesion/mass effect. Vascular: No hyperdense vessel or unexpected calcification. Skull: Normal. Negative for fracture or focal lesion. Sinuses/Orbits: No acute finding. Other: None. IMPRESSION: No acute intracranial pathology. Electronically Signed   By: Dahlia Bailiff MD   On: 11/01/2020 14:32   DG Chest Portable 1 View  Result Date: 11/01/2020 CLINICAL DATA:  Altered mental status. EXAM: PORTABLE CHEST 1 VIEW COMPARISON:  No prior. FINDINGS: Mediastinum and hilar structures normal. Heart size normal. No focal infiltrate. No pleural effusion or pneumothorax. Degenerative change thoracic spine. IMPRESSION: No acute cardiopulmonary disease.  Electronically Signed   By: Marcello Moores  Register   On: 11/01/2020 14:15    Procedures Procedures  Medications Ordered in ED Medications - No data to display  ED Course  I have reviewed the triage vital signs and the nursing notes.  Pertinent labs & imaging results that were available during my care of the patient were reviewed by me and considered in my medical decision making (see chart for details).    MDM Rules/Calculators/A&P                          Patient with confusion. He is not ill  appearing. CT head and CXR are benign. I tried to call family with no response. Labs are currently pending.  Care to Dr. Alvino Chapel. Final Clinical Impression(s) / ED Diagnoses Final diagnoses:  None    Rx / DC Orders ED Discharge Orders    None       Sherwood Gambler, MD 11/01/20 1511

## 2020-11-01 NOTE — H&P (Addendum)
[facility] Admission History and Physical Service Pager: [phone]  Patient name: Angel Webster Medical record number: [id number] Date of birth: [birth year] Age: 60 y.o. Gender: male  Primary Care Provider: Kerin Perna, NP Consultants: None Code Status: FULL Preferred Emergency Contact: Daughter Angel Webster) [phone]; Angel Webster)  Chief Complaint: Altered mental status  Assessment and Plan: Angel Webster is a 60 y.o. male presenting with altered mental status. PMH is significant for alcoholic cirrhosis, hypertension, GERD, and asthma. History is limited by patient's confusion and very few prior medical records in chart review.  Altered mental status  Hepatic encephalopathy Patient presented with altered mental status. He complains of feeling "shaky" but otherwise is unable to say why he came to the hospital. On exam he is alert and oriented to self and city (not time), but does not answer questions reliably (ie says he doesn't have a daughter, says he's never had alcohol). Labs in the ED remarkable for ammonia 192, AST 118, ALT 64, alk phos 199, albumin 2.4, bilirubin 4.1, platelets 62. Ethanol level <10, UDS negative. CT head was obtained and was normal. He has a known hx of alcoholic cirrhosis and hepatic encephalopathy per chart review. He is prescribed lactulose 20g TID, Rifaximin 550mg  BID, Lasix 40mg  BID, and spironolactone 100mg  daily at home although his compliance is unclear. Etiology of his altered mental status most likely hepatic encephalopathy. Less likely infectious as he is afebrile with normal WBC, normal CXR and UA. Do not suspect intracranial pathology given normal head CT and non-focal neuro exam. No concern for drugs or volatile alcohol ingestion at this time as UDS is negative and no anion gap. Glucose and BUN were also normal making uremia and hypoglycemia less likely.  -Admit to FPTS, progressive, attending Dr. Ardelia Webster -Vitals  per routine -Neuro checks q4h -Diet NPO due to altered mental status -Lactulose 20mg  TID -Goal 3-4 bowel movements daily -Continue home Rifaximin, Spironolactone, and Lasix (will do IV Lasix for now due to NPO) -Continue home zinc supplementation -Will obtain PT/INR -Calculate MELD score when INR results -Repeat CMP, ammonia tomorrow am -Check TSH -Will contact daughter and cousin for additional history  Cirrhosis  ?Alcohol Use Disorder Cirrhosis is secondary to alcohol use per chart review and labs are consistent with this. However, patient has very few prior records and is not a reliable historian. Ethanol level <10 on arrival and patient unable to say when his last drink was. Pt could benefit from GI follow up outpatient.  -Will obtain acute hepatitis panel -Thiamine 100mg  daily -Folic acid 1mg  daily -Seizure precautions, monitor for potential withdrawal -Consider abdominal imaging to evaluate for progression of cirrhosis and/or presence of ascites -Could consider obtaining auto-immune workup for underlying etiology of cirrhosis but will hold off for now -Remainder of management as discussed above -Consider outpatient GI follow up   Hypertension Patient with a hx of HTN per chart review. He has been normotensive since arrival (BP range 36-144 systolic). Takes carvedilol 3.125 mg BID at home- possibly for HTN although exact indication is not clear per chart review. He's also on Lasix and spironolactone as above. -Monitor BP -Holding home carvedilol for now  GERD Chronic, stable. Home meds include Protonix 40mg  BID -Continue home Protonix  Thrombocytopenia Platelets 62, likely secondary to his cirrhosis and liver dysfunction. -Daily CBC -Discontinue Lovenox if platelets drop below 50  Vitiligo Chronic, stable. PCP placed referral for dermatology. - Follow-up outpatient   Obesity  Given his BMI 146 kg with check  Hgb A1c and TSH. Recent lipid panel reviewed.   FEN/GI:  NPO, except meds due to altered mental status Prophylaxis: Lovenox (d/c if platelets drop <50)  Disposition: Progressive  History of Present Illness:  Angel Webster is a 60 y.o. male presenting with altered mental status.  Patient states he came to the hospital because he felt jittery. Patient denies all other complaints including pain, headache, cough, congestion, sore throat, fever, chills, chest pain, abdominal pain, N/V/D, constipation.  Reports last BM "has been a while."   Per ED note, patient's cousin said he was altered from baseline. Patient states he lives with his cousin here in [place].  History is extremely limited due to patient confusion and overall few records available via chart review. He does not reliably answer questions. Initially said he does not have a daughter. When asked who Angel Webster is, he says "my daughter". Also reports he has never drank alcohol, but later admits it has been a long time since he's had alcohol. He's unable to use his phone properly (can't unlock the home screen) in attempt to find his cousins phone number.   Review Of Systems: Per HPI with the following additions:   Review of Systems  Unable to perform ROS: Mental status change  Psychiatric/Behavioral: Positive for confusion.     Patient Active Problem List   Diagnosis Date Noted  . Hepatic encephalopathy ([place]) 11/01/2020    Past Medical History: Past Medical History:  Diagnosis Date  . Asthma   . Hypertension     Past Surgical History: No past surgical history on file.  Social History: Social History   Tobacco Use  . Smoking status: Current Every Day Smoker  . Smokeless tobacco: Current User  Substance Use Topics  . Alcohol use: Not Currently  . Drug use: Not Currently    Family History: Family History  Family history unknown: Yes    Allergies and Medications: Allergies  Allergen Reactions  . Ace Inhibitors Swelling    Tongue swelling   No current  facility-administered medications on file prior to encounter.   Current Outpatient Medications on File Prior to Encounter  Medication Sig Dispense Refill  . lactulose (CHRONULAC) 10 GM/15ML solution take 30 milliliters by mouth three times a day 1892 mL 3  . albuterol (VENTOLIN HFA) 108 (90 Base) MCG/ACT inhaler SMARTSIG:2 Puff(s) By Mouth Every 4-6 Hours PRN    . carvedilol (COREG) 3.125 MG tablet Take 1 tablet (3.125 mg total) by mouth 2 (two) times daily. 180 tablet 1  . CONTOUR NEXT TEST test strip SMARTSIG:Via Meter    . cyclobenzaprine (FLEXERIL) 10 MG tablet Take by mouth.    . folic acid (FOLVITE) 1 MG tablet Take 1 mg by mouth daily.    . furosemide (LASIX) 40 MG tablet Take 1 tablet (40 mg total) by mouth 2 (two) times daily. 180 tablet 1  . ibuprofen (ADVIL) 600 MG tablet Take by mouth.    . lidocaine (LIDODERM) 5 % 1 patch daily.    Marland Kitchen NARCAN 4 MG/0.1ML LIQD nasal spray kit SMARTSIG:1 Spray(s) Both Nares Once PRN    . pantoprazole (PROTONIX) 40 MG tablet Take 1 tablet (40 mg total) by mouth 2 (two) times daily. 90 tablet 1  . spironolactone (ALDACTONE) 100 MG tablet Take 1 tablet (100 mg total) by mouth daily. 90 tablet 1  . XIFAXAN 550 MG TABS tablet Take 1 tablet (550 mg total) by mouth 2 (two) times daily. 60 tablet 1  . zinc sulfate 220 (50  Zn) MG capsule Take 1 capsule (220 mg total) by mouth daily. 90 capsule 1    Objective: BP 99/72   Pulse (!) 58   Temp (!) 97.4 F (36.3 C) (Oral)   Resp 13   SpO2 97%  Exam: General: alert, resting comfortably in bed Eyes: mild scleral icterus, visual fields intact ENTM: moist mucous membranes, oropharynx clear Neck: no cervical lymphadenopathy Cardiovascular: RRR, normal S1/S2 without m/r/g Respiratory: normal WOB on room air, lungs CTAB Gastrointestinal: obese, +BS, soft, nontender, no appreciable fluid wave, no hepatosplenomegaly MSK: 1+ pitting edema of bilateral lower extremities up to the knee Derm: diffuse areas of  hypopigmentation c/w vitiligo Neuro: alert, oriented to self and city only. Able to tell time on the clock. Follows commands appropriately. 5/5 strength in all extremities. Sensation intact in all extremities. Cranial nerve exam somewhat limited by patient confusion, but no focal deficits appreciated.  +asterixis  Labs and Imaging: CBC BMET  Recent Labs  Lab 11/01/20 1601  WBC 6.5  HGB 14.4  HCT 40.1  PLT 62*   Recent Labs  Lab 11/01/20 1601  NA 135  K 4.5  CL 104  CO2 24  BUN 12  CREATININE 0.82  GLUCOSE 135*  CALCIUM 8.5*     CT Head Wo Contrast Result Date: 11/01/2020 IMPRESSION: No acute intracranial pathology.   DG Chest Portable 1 View Result Date: 11/01/2020 IMPRESSION: No acute cardiopulmonary disease.    Alcus Dad, MD 11/01/2020, 7:45 PM PGY-1, [facility] Intern pager: [phone], text pages welcome   FPTS Upper-Level Resident Addendum   I have independently interviewed and examined the patient. I have discussed the above with the original author and agree with their documentation. My edits for correction/addition/clarification are within the document. Please see also any attending notes.   Lyndee Hensen, DO PGY-2, [facility] 11/02/2020 1:15 AM  FPTS Service pager: [phone] (text pages welcome through [facility])

## 2020-11-01 NOTE — ED Provider Notes (Signed)
  Physical Exam  BP 99/72   Pulse (!) 58   Temp (!) 97.4 F (36.3 C) (Oral)   Resp 13   SpO2 97%   Physical Exam  ED Course/Procedures     Procedures  MDM  Received patient in signout. Mental status changes. History of hepatic encephalopathy. Questionable compliance with medications. Ammonia is 192. With the confusion will admit to hospital. Patient's primary care is [facility]. Will discuss with unassigned.       Benjiman Core, MD 11/01/20 [phone]

## 2020-11-02 DIAGNOSIS — K7030 Alcoholic cirrhosis of liver without ascites: Secondary | ICD-10-CM

## 2020-11-02 DIAGNOSIS — D696 Thrombocytopenia, unspecified: Secondary | ICD-10-CM

## 2020-11-02 LAB — COMPREHENSIVE METABOLIC PANEL
ALT: 74 U/L — ABNORMAL HIGH (ref 0–44)
AST: 111 U/L — ABNORMAL HIGH (ref 15–41)
Albumin: 2.5 g/dL — ABNORMAL LOW (ref 3.5–5.0)
Alkaline Phosphatase: 198 U/L — ABNORMAL HIGH (ref 38–126)
Anion gap: 7 (ref 5–15)
BUN: 12 mg/dL (ref 6–20)
CO2: 25 mmol/L (ref 22–32)
Calcium: 8.9 mg/dL (ref 8.9–10.3)
Chloride: 103 mmol/L (ref 98–111)
Creatinine, Ser: 1 mg/dL (ref 0.61–1.24)
GFR, Estimated: >60 mL/min (ref >60)
Glucose, Bld: 109 mg/dL — ABNORMAL HIGH (ref 70–99)
Potassium: 4.5 mmol/L (ref 3.5–5.1)
Sodium: 135 mmol/L (ref 135–145)
Total Bilirubin: 4.7 mg/dL — ABNORMAL HIGH (ref 0.3–1.2)
Total Protein: 7.3 g/dL (ref 6.5–8.1)

## 2020-11-02 LAB — MAGNESIUM: Magnesium: 1.7 mg/dL (ref 1.7–2.4)

## 2020-11-02 LAB — PHOSPHORUS: Phosphorus: 3.2 mg/dL (ref 2.5–4.6)

## 2020-11-02 LAB — CBC
HCT: 44.8 % (ref 39.0–52.0)
Hemoglobin: 15.5 g/dL (ref 13.0–17.0)
MCH: 34 pg (ref 26.0–34.0)
MCHC: 34.6 g/dL (ref 30.0–36.0)
MCV: 98.2 fL (ref 80.0–100.0)
Platelets: 84 10*3/uL — ABNORMAL LOW (ref 150–400)
RBC: 4.56 MIL/uL (ref 4.22–5.81)
RDW: 15.7 % — ABNORMAL HIGH (ref 11.5–15.5)
WBC: 7.7 10*3/uL (ref 4.0–10.5)
nRBC: 0 % (ref 0.0–0.2)

## 2020-11-02 LAB — AMMONIA: Ammonia: 88 umol/L — ABNORMAL HIGH (ref 9–35)

## 2020-11-02 LAB — SARS CORONAVIRUS 2 (TAT 6-24 HRS): SARS Coronavirus 2: NEGATIVE

## 2020-11-02 NOTE — ED Notes (Signed)
Placed pt on hospital bed for comfort. 

## 2020-11-02 NOTE — Social Work (Signed)
TOC supervisor contacted EDCSW to assist with transportation needs. Per supervisor Pt was provided with a taxi voucher to d/c home.

## 2020-11-02 NOTE — Progress Notes (Addendum)
Family Medicine Teaching Service Daily Progress Note Intern Pager: [phone]  Patient name: Angel Webster Medical record number: [id number] Date of birth: [birth year] Age: 60 y.o. Gender: male  Primary Care Provider: Kerin Perna, NP Consultants: None Code Status: Full  Pt Overview and Major Events to Date:  2/3 Admitted   Assessment and Plan: Angel Webster is a 60 y.o. male presenting with altered mental status. PMH is significant for alcoholic cirrhosis, hypertension, GERD, and asthma. History is limited by patient's confusion and very few prior medical records in chart review.  Altered mental status  Hepatic encephalopathy Patient presented with altered mental status complaining of shakiness likely secondary to hepatic encephalopathy given known history of cirrhosis and hepatic encephalopathy. Labs in the ED include ammonia 192, AST 118, ALT 64, alk phos 199, albumin 2.4, bilirubin 4.1, platelets 62. Ethanol level <10 and UDS negative.  Home medications include lactulose 20g TID, Rifaximin 550mg  BID, Lasix 40mg  BID, and spironolactone 100mg  daily. -continue lactulose 20 mg tid -consider adding diet given no longer altered -monitor mental status -monitor BM, goal of 3-4 daily -continue home Rifaximin, Spironolactone, and Lasix (IV Lasix while NPO) -zinc supplementation  -seizure precautions -pending PT-INR for MELD score -f/u repeat ammonia  -will attempt to contact cousin, patient wants [place] to avoid contacting daughter since she gets worried, lives with cousin so more beneficial for further background and history  Cirrhosis  Alcohol Use Disorder Known history of alcoholic cirrhosis likely secondary to alcohol use. On admission ethanol level <10. Patient does not remember last drink, currently does not drink. Endorses prior extensive alcohol use 20 years ago drinking at least a pink of vodka daily. -pending acute hepatitis panel -Thiamine 100mg  daily -Folic acid 1mg   daily -Seizure precautions -consider CT abdomen if symptoms worsen  -patient will likely benefit from GI outpatient f/u   Hypertension Normotensive this morning BP 125/84. Home medications include lasix 20 mg, spironolactone 100 mg daily and carvedilol 3.125 mg bid. -continue home spironolactone and lasix -continue to hold carvedilol  -monitor BP  GERD Chronic, stable. Home meds include Protonix 40mg  BID -Continue home Protonix  Thrombocytopenia Platelets 84 this morning compared to on admission platelets 62, likely secondary to his cirrhosis and alcohol use disorder. - monitor CBC  -Discontinue Lovenox if platelets drop below 50  Vitiligo Chronic, stable. PCP placed referral for dermatology. - Follow-up outpatient   Obesity  BMI 146 kg. Most recent lipid panel on 10/26/2020 notable for elevated triglycerides 208 and LDL 118. -pending A1c -pending TSH -outpatient follow up, consider initiation of statin   FEN/GI: low sodium diet  PPx: Lovenox 70 mg (d/c if platelets drop <50)   Status is: Inpatient    Dispo: Progressive        Subjective:  No acute overnight events. Patient curious about when he will be moved to a room, no other concerns at this time.   Objective: Temp:  [97.4 F (36.3 C)] 97.4 F (36.3 C) (02/03 1352) Pulse Rate:  [54-72] 62 (02/04 0400) Resp:  [12-20] 12 (02/04 0400) BP: (99-137)/(65-102) 125/84 (02/04 0400) SpO2:  [94 %-100 %] 94 % (02/04 0400) Physical Exam: General: Patient laying comfortably in bed watching tv, in no acute distress. Eyes: mild scleral icterus  Cardiovascular: RRR, no murmurs or gallops auscultated  Respiratory: lungs clear to auscultation bilaterally  Abdomen: soft, nontender, presence of active bowel sounds, no evidence of organomegaly  Extremities: no palmar erythema noted, spider angiomas noted along the right foot, mild pedal edema noted bilaterally,  radial and dorsalis pedis pulses strong and equal  bilaterally  Derm: skin warm and dry to touch, hypopigmented patches noted systemically consistent with vitiligo  Neuro: AOx3, CN 2-12, no pronator drift noted, no asterixis noted, sensation grossly intact, 5/5 UE and LE strength bilaterally  Psych: mood appropriate, pleasant, denies both auditory or visual hallucinations   Laboratory: Recent Labs  Lab 10/26/20 0831 11/01/20 1601 11/02/20 0140  WBC 7.6 6.5 7.7  HGB 14.1 14.4 15.5  HCT 38.8 40.1 44.8  PLT 88* 62* 84*   Recent Labs  Lab 10/26/20 0831 11/01/20 1601 11/02/20 0140  NA 133* 135 135  K 4.3 4.5 4.5  CL 99 104 103  CO2 $Re'23 24 25  'vmT$ BUN $R'15 12 12  'uO$ CREATININE 1.01 0.82 1.00  CALCIUM 9.4 8.5* 8.9  PROT 7.1 6.8 7.3  BILITOT 3.7* 4.1* 4.7*  ALKPHOS 317* 199* 198*  ALT 69* 64* 74*  AST 114* 118* 111*  GLUCOSE 100* 135* 109*      Imaging/Diagnostic Tests: CT Head Wo Contrast  Result Date: 11/01/2020 CLINICAL DATA:  Forgetfulness and confusion since yesterday, altered mental status EXAM: CT HEAD WITHOUT CONTRAST TECHNIQUE: Contiguous axial images were obtained from the base of the skull through the vertex without intravenous contrast. COMPARISON:  None. FINDINGS: Brain: No evidence of acute infarction, hemorrhage, hydrocephalus, extra-axial collection or mass lesion/mass effect. Vascular: No hyperdense vessel or unexpected calcification. Skull: Normal. Negative for fracture or focal lesion. Sinuses/Orbits: No acute finding. Other: None. IMPRESSION: No acute intracranial pathology. Electronically Signed   By: Dahlia Bailiff MD   On: 11/01/2020 14:32   DG Chest Portable 1 View  Result Date: 11/01/2020 CLINICAL DATA:  Altered mental status. EXAM: PORTABLE CHEST 1 VIEW COMPARISON:  No prior. FINDINGS: Mediastinum and hilar structures normal. Heart size normal. No focal infiltrate. No pleural effusion or pneumothorax. Degenerative change thoracic spine. IMPRESSION: No acute cardiopulmonary disease. Electronically Signed   By: Marcello Moores   Register   On: 11/01/2020 14:15    Donney Dice, DO 11/02/2020, 6:36 AM PGY-1, [place] Intern pager: [phone], text pages welcome

## 2020-11-02 NOTE — ED Notes (Signed)
Pt is NSR on monitor 

## 2020-11-02 NOTE — Evaluation (Signed)
Physical Therapy Evaluation Patient Details Name: Angel Webster MRN: [id number] DOB: 01/10/1961 Today's Date: 11/02/2020   History of Present Illness  60 yo M with history of alcoholic cirrhosis, hypertension, and asthma who presents with AMS.  Clinical Impression  Pt presents to PT with no significant deficits per his report. Pt is able to ambulate household distances independently at this time and reports no concerns about his mobility currently. Pt requires no further acute PT services at this time. Acute PT signing off. No PT or DME needs at the time of discharge.    Follow Up Recommendations No PT follow up    Equipment Recommendations  None recommended by PT    Recommendations for Other Services       Precautions / Restrictions Precautions Precautions: None Restrictions Weight Bearing Restrictions: No      Mobility  Bed Mobility Overal bed mobility: Independent                  Transfers Overall transfer level: Independent                  Ambulation/Gait Ambulation/Gait assistance: Independent Gait Distance (Feet): 150 Feet Assistive device: None Gait Pattern/deviations: Step-through pattern Gait velocity: functional Gait velocity interpretation: 1.31 - 2.62 ft/sec, indicative of limited community ambulator General Gait Details: pt with steady step-through gait, no significant balance deviations  Stairs            Wheelchair Mobility    Modified Rankin (Stroke Patients Only)       Balance Overall balance assessment: Mild deficits observed, not formally tested                                           Pertinent Vitals/Pain Pain Assessment: No/denies pain    Home Living Family/patient expects to be discharged to:: Private residence Living Arrangements: Other relatives (cousin) Available Help at Discharge: Family;Available 24 hours/day Type of Home: House Home Access: Level entry     Home Layout: One  level Home Equipment: None      Prior Function Level of Independence: Independent               Hand Dominance        Extremity/Trunk Assessment   Upper Extremity Assessment Upper Extremity Assessment: Overall WFL for tasks assessed    Lower Extremity Assessment Lower Extremity Assessment: Overall WFL for tasks assessed    Cervical / Trunk Assessment Cervical / Trunk Assessment: Normal  Communication   Communication: No difficulties  Cognition Arousal/Alertness: Awake/alert Behavior During Therapy: WFL for tasks assessed/performed Overall Cognitive Status: No family/caregiver present to determine baseline cognitive functioning                                 General Comments: pt is alert and oriented x4, follows commands well      General Comments General comments (skin integrity, edema, etc.): VSS on RA    Exercises     Assessment/Plan    PT Assessment Patent does not need any further PT services  PT Problem List         PT Treatment Interventions      PT Goals (Current goals can be found in the Care Plan section)       Frequency     Barriers to discharge  Co-evaluation               AM-PAC PT "6 Clicks" Mobility  Outcome Measure Help needed turning from your back to your side while in a flat bed without using bedrails?: None Help needed moving from lying on your back to sitting on the side of a flat bed without using bedrails?: None Help needed moving to and from a bed to a chair (including a wheelchair)?: None Help needed standing up from a chair using your arms (e.g., wheelchair or bedside chair)?: None Help needed to walk in hospital room?: None Help needed climbing 3-5 steps with a railing? : A Little 6 Click Score: 23    End of Session   Activity Tolerance: Patient tolerated treatment well Patient left: in bed;with call bell/phone within reach Nurse Communication: Mobility status      Time:  1435-1445 PT Time Calculation (min) (ACUTE ONLY): 10 min   Charges:   PT Evaluation $PT Eval Low Complexity: 1 Low          Arlyss Gandy, PT, DPT Acute Rehabilitation Pager: [phone]   Arlyss Gandy 11/02/2020, 2:56 PM

## 2020-11-02 NOTE — Hospital Course (Addendum)
Angel Webster is a 60 y.o. male presenting with altered mental status. PMH is significant for alcoholic cirrhosis, hypertension, GERD, and asthma. History is limited by patient's confusion and very few prior medical records in chart review.   Altered mental status  Hepatic encephalopathy Patient presented with altered mental status. He complains of feeling "shaky" but otherwise is unable to say why he came to the hospital. On exam he is alert and oriented to self and city (not time), but does not answer questions reliably (ie says he doesn't have a daughter, says he's never had alcohol). Labs in the ED remarkable for ammonia 192, AST 118, ALT 64, alk phos 199, albumin 2.4, bilirubin 4.1, platelets 62. Ethanol level <10, UDS negative. CT head was obtained and was normal. ***    Cirrhosis  ?Alcohol Use Disorder Cirrhosis is secondary to alcohol use per chart review and labs are consistent with this. However, patient has very few prior records and is not a reliable historian. Ethanol level <10 on arrival and patient unable to say when his last drink was. Pt could benefit from GI follow up outpatient. ***    Thrombocytopenia Platelets 62, likely secondary to his cirrhosis and liver dysfunction. ***     All other issues chronic and stable.   Issues for follow up: Follow up with PCP in 1-2 weeks, consider GI consult given extensive hepatic history.

## 2020-11-02 NOTE — Progress Notes (Signed)
FPTS Interim Progress Note  Still unclear as to surroundings events leading up to this hospitalization. In efforts to contact family, I asked patient if he was agreeable to the team contacting daughter for further assistance. Patient does not want [place] to contact daughter because " she will worry." Daughter's number is listed as contact but wants [place] to only contact Barbara Cower, his cousin who he lives with. Patient currently has been living with Barbara Cower since 09/01/2020, prior to that was living with aunt for a few weeks and prior to that lived in [place] (moved In November 2021). Patient is unsure of his prior living situation or health history but knows that he used to live with his ex-girlfriend who reports that he has a history of hepatic dysfunction and had a hepatic-related procedure but is unclear of any specifics beyond this. Barbara Cower is retired and stays with patient all day, reports that patient drinks 2 beers a day. At baseline, he is AOx4 and has no limitations to ambulation. Barbara Cower said that his last known normal was 3 days ago. Starting on 2/2, he noticed that patient had no appetite,  less energy and appeared "jittery". Asked if he took his meds which he has been compliant. Denies any falls or head trauma. Denies urinary incontinence. Has never experienced this before. He was not able to tell Barbara Cower his name, city and address which he typically knows at baseline. Barbara Cower is agreeable to team contacting him as needed.   Reece Leader, DO 11/02/2020, 12:04 PM PGY-1, [facility] Family Medicine Service pager [phone]

## 2020-11-02 NOTE — Progress Notes (Signed)
Pt dsicharged home via [facility]., discharge teaching done.

## 2020-11-02 NOTE — Discharge Instructions (Signed)
You were admitted to the hospital for hepatic encephalopathy.  There was concern about increased confusion compared to normal.  While you were here in the hospital, we continued your home medications and the confusion seemed to resolve.  You are discharged on all of your home medicine without any new medication.  Remember to take your lactulose and rifaximin daily.  It is very important for you to totally avoid alcohol in order to avoid further injury to your compromised liver.  Please follow-up with your primary care physician in the next 1-2 weeks.  Your primary care doctor needs to help you get set up with a GI doctor who can monitor your liver and provide appropriate options moving forward.  I would recommend the also get set up with a dermatologist who might be able to provide additional help with your vitiligo.

## 2020-11-02 NOTE — Discharge Summary (Addendum)
Angel Webster  Patient name: Angel Webster Medical record number: [id number] Date of birth: [birth year] Age: 60 y.o. Gender: male Date of Admission: 11/01/2020  Date of Discharge: 11/02/2020 Admitting Physician: Alcus Dad, MD  Primary Care Provider: Kerin Perna, NP Consultants: none  Indication for Hospitalization: altered mental status   Discharge Diagnoses/Problem List:  Altered mental status  Hepatic encephalopathy Cirrhosis Alcohol use disorder Hypertension GERD Thrombocytopenia Vitiligo  Obesity   Disposition: home  Discharge Condition: medically stable   Discharge Exam:   Temp:  [97.4 F (36.3 C)] 97.4 F (36.3 C) (02/03 1352) Pulse Rate:  [54-72] 62 (02/04 0400) Resp:  [12-20] 12 (02/04 0400) BP: (99-137)/(65-102) 125/84 (02/04 0400) SpO2:  [94 %-100 %] 94 % (02/04 0400) Physical Exam: General: Patient laying comfortably in bed watching tv, in no acute distress. Eyes: mild scleral icterus  Cardiovascular: RRR, no murmurs or gallops auscultated  Respiratory: lungs clear to auscultation bilaterally  Abdomen: soft, nontender, presence of active bowel sounds, no evidence of organomegaly  Extremities: no palmar erythema noted, spider angiomas noted along the right foot, mild pedal edema noted bilaterally, radial and dorsalis pedis pulses strong and equal bilaterally  Derm: skin warm and dry to touch, hypopigmented patches noted systemically consistent with vitiligo  Neuro: AOx3, CN 2-12, no pronator drift noted, no asterixis noted, sensation grossly intact, 5/5 UE and LE strength bilaterally  Psych: mood appropriate, pleasant, denies both auditory or visual hallucinations   Brief Hospital Course:  Angel Webster is a 60 y.o. male presenting with altered mental status. PMH is significant for alcoholic cirrhosis, hypertension, GERD, and asthma. History is limited by patient's confusion and very few prior medical  records in chart review.   Altered mental status  Hepatic encephalopathy Patient presented with altered mental status. He complains of feeling "shaky" but otherwise is unable to say why he came to the hospital. On exam he is alert and oriented to self and city (not time), but does not answer questions reliably (ie says he doesn't have a daughter, says he's never had alcohol). Labs in the ED remarkable for ammonia 192, AST 118, ALT 64, alk phos 199, albumin 2.4, bilirubin 4.1, platelets 62. Ethanol level <10, UDS negative. CT head was obtained and was normal. Patient's monitored for additional symptom. Altered mental status thought to be due to hepatic encephalopathy which resolved and patient was discharged the next day.   Cirrhosis  Alcohol Use Disorder Cirrhosis is secondary to alcohol use per chart review and labs are consistent with this. However, patient has very few prior records and is not a reliable historian. Ethanol level <10 on arrival and patient unable to say when his last drink was. Patient could benefit from GI follow up outpatient.   Hypertension Patient with a hx of HTN per chart review. He has been normotensive since arrival (BP range 42-595 systolic). Takes carvedilol 3.125 mg BID at home- possibly for HTN although exact indication is not clear per chart review. Discharged home with coreg, lasix and spironolactone. At discharge, patient's BP was normotensive at 134/87.  Thrombocytopenia Platelets 62, likely secondary to his cirrhosis and history of alcohol use disorder. Improved at discharge at platelets 84. No further intervention performed.     All other issues chronic and stable.     Issues for Follow Up:  1. Follow up with PCP in 1-2 weeks, consider GI consult given extensive hepatic history.  2. Refer to dermatology outpatient for vitiligo.  3. Repeat CBC  outpatient given thrombocytopenia.   Significant Procedures:  none  Significant Labs and Imaging:  Recent  Labs  Lab 11/01/20 1601 11/02/20 0140  WBC 6.5 7.7  HGB 14.4 15.5  HCT 40.1 44.8  PLT 62* 84*   Recent Labs  Lab 11/01/20 1601 11/02/20 0140  NA 135 135  K 4.5 4.5  CL 104 103  CO2 24 25  GLUCOSE 135* 109*  BUN 12 12  CREATININE 0.82 1.00  CALCIUM 8.5* 8.9  ALKPHOS 199* 198*  AST 118* 111*  ALT 64* 74*  ALBUMIN 2.4* 2.5*      Results/Tests Pending at Time of Discharge:  none  Discharge Medications:  Allergies as of 11/02/2020      Reactions   Ace Inhibitors Swelling   Tongue swelling      Medication List    STOP taking these medications   ibuprofen 600 MG tablet Commonly known as: ADVIL     TAKE these medications   albuterol 108 (90 Base) MCG/ACT inhaler Commonly known as: VENTOLIN HFA Inhale 1-2 puffs into the lungs every 4 (four) hours as needed for wheezing or shortness of breath.   carvedilol 3.125 MG tablet Commonly known as: COREG Take 1 tablet (3.125 mg total) by mouth 2 (two) times daily.   Contour Next Test test strip Generic drug: glucose blood SMARTSIG:Via Meter   cyclobenzaprine 10 MG tablet Commonly known as: FLEXERIL Take by mouth.   folic acid 1 MG tablet Commonly known as: FOLVITE Take 1 mg by mouth daily.   furosemide 40 MG tablet Commonly known as: LASIX Take 1 tablet (40 mg total) by mouth 2 (two) times daily.   lactulose 10 GM/15ML solution Commonly known as: CHRONULAC take 30 milliliters by mouth three times a day   lidocaine 5 % Commonly known as: LIDODERM 1 patch daily.   Narcan 4 MG/0.1ML Liqd nasal spray kit Generic drug: naloxone Place 1 spray into the nose as needed (overdose).   pantoprazole 40 MG tablet Commonly known as: PROTONIX Take 1 tablet (40 mg total) by mouth 2 (two) times daily.   spironolactone 100 MG tablet Commonly known as: ALDACTONE Take 1 tablet (100 mg total) by mouth daily.   Xifaxan 550 MG Tabs tablet Generic drug: rifaximin Take 1 tablet (550 mg total) by mouth 2 (two) times  daily.   zinc sulfate 220 (50 Zn) MG capsule Take 1 capsule (220 mg total) by mouth daily.       Discharge Instructions: Please refer to Patient Instructions section of EMR for full details.  Patient was counseled important signs and symptoms that should prompt return to medical care, changes in medications, dietary instructions, activity restrictions, and follow up appointments.   Follow-Up Appointments:  Follow-up Information    Kerin Perna, NP. Schedule an appointment as soon as possible for a visit.   Specialty: Internal Medicine Contact information: [place] [address] [place], [place], DO 11/02/2020, 4:07 PM PGY-1, [place] Upper-Level Resident Addendum   I have independently interviewed and examined the patient. I have discussed the above with the original author and agree with their documentation. Please see also any attending notes.    Carollee Leitz, MD PGY-2, [place] 11/04/2020 5:56 PM  FPTS Service pager: [phone] (text pages welcome through [place])

## 2020-11-05 ENCOUNTER — Telehealth: Payer: Self-pay | Admitting: *Deleted

## 2020-11-05 ENCOUNTER — Telehealth: Payer: Self-pay

## 2020-11-05 NOTE — Telephone Encounter (Signed)
Transition Care Management Follow-up Telephone Call  Date of discharge and from where: 11/02/2020, [facility]   How have you been since you were released from the hospital? He said he has only been home a few hours but had no concerns  Any questions or concerns? No  Items Reviewed:  Did the pt receive and understand the discharge instructions provided? Yes   Medications obtained and verified? Yes  - he said he has all medications and did not have any questions about his med regime.   Other? No   Any new allergies since your discharge? No   Do you have support at home? Yes  - he is living with his cousin  Home Care and Equipment/Supplies: Were home health services ordered? no If so, what is the name of the agency? n/a Has the agency set up a time to come to the patient's home? n/a Were any new equipment or medical supplies ordered?  No What is the name of the medical supply agency? n/a Were you able to get the supplies/equipment?n/a Do you have any questions related to the use of the equipment or supplies? No n/a  Functional Questionnaire: (I = Independent and D = Dependent) ADLs: independent  Follow up appointments reviewed:   PCP Hospital f/u appt confirmed? Yes  Gwinda Passe, NP 11/14/2020.   Specialist Hospital f/u appt confirmed? No , none scheduled at this time.  Are transportation arrangements needed? No  -provided him with the phone number to contact [facility] medicaid transportation. He is aware that he needs to call 3 days prior to his appointment to schedule his ride.   If their condition worsens, is the pt aware to call PCP or go to the Emergency Dept.? Yes  Was the patient provided with contact information for the PCP's office or ED? Yes  Was to pt encouraged to call back with questions or concerns? Yes

## 2020-11-05 NOTE — Telephone Encounter (Signed)
Transition Care Management Unsuccessful Follow-up Telephone Call  Date of discharge and from where:  11/03/2020 - [facility]  Attempts:  1st Attempt  Reason for unsuccessful TCM follow-up call:  Unable to reach patient

## 2020-11-06 NOTE — Telephone Encounter (Signed)
Transition Care Management Follow-up Telephone Call  Date of discharge and from where: 11/03/2020 - [facility]  How have you been since you were released from the hospital? "I am doing much better"  Any questions or concerns? No  Items Reviewed:  Did the pt receive and understand the discharge instructions provided? Yes   Medications obtained and verified? Yes   Other? N/A  Any new allergies since your discharge? No   Dietary orders reviewed? Yes  Do you have support at home? Yes   Home Care and Equipment/Supplies: Were home health services ordered? not applicable If so, what is the name of the agency? N/A  Has the agency set up a time to come to the patient's home? not applicable Were any new equipment or medical supplies ordered?  No What is the name of the medical supply agency? N/A Were you able to get the supplies/equipment? not applicable Do you have any questions related to the use of the equipment or supplies? No  Functional Questionnaire: (I = Independent and D = Dependent) ADLs: I  Bathing/Dressing- I  Meal Prep- I  Eating- I  Maintaining continence- I  Transferring/Ambulation- I  Managing Meds- I  Follow up appointments reviewed:   PCP Hospital f/u appt confirmed? Yes  Scheduled to see Dr. Randa Evens on 11/14/2020 @ 1430.  Specialist Hospital f/u appt confirmed? N/A   Are transportation arrangements needed? No   If their condition worsens, is the pt aware to call PCP or go to the Emergency Dept.? Yes  Was the patient provided with contact information for the PCP's office or ED? Yes  Was to pt encouraged to call back with questions or concerns? Yes

## 2020-11-14 ENCOUNTER — Encounter (INDEPENDENT_AMBULATORY_CARE_PROVIDER_SITE_OTHER): Payer: Self-pay | Admitting: Primary Care

## 2020-11-14 ENCOUNTER — Other Ambulatory Visit: Payer: Self-pay

## 2020-11-14 ENCOUNTER — Ambulatory Visit (INDEPENDENT_AMBULATORY_CARE_PROVIDER_SITE_OTHER): Payer: Medicaid Other | Admitting: Primary Care

## 2020-11-14 DIAGNOSIS — K219 Gastro-esophageal reflux disease without esophagitis: Secondary | ICD-10-CM | POA: Diagnosis not present

## 2020-11-14 MED ORDER — PANTOPRAZOLE SODIUM 40 MG PO TBEC: 40.0000 mg | DELAYED_RELEASE_TABLET | Freq: Two times a day (BID) | ORAL | 1 refills | Status: DC

## 2020-11-14 NOTE — Progress Notes (Signed)
Established Patient Office Visit  Subjective:  Patient ID: Angel Webster, male    DOB: 1961/05/14  Age: 60 y.o. MRN: [id number]  CC:  Chief Complaint  Patient presents with  . Hospitalization Follow-up    Hepatic encephalopathy    HPI [name] 60 y.o.male presents for follow up from the hospital. Admit date to the hospital was 11/01/20, patient was discharged from the hospital on 11/02/20, patient was admitted for: altered mental status  and Hepatic encephalopathy Requesting refills.   Past Medical History:  Diagnosis Date  . Asthma   . Hypertension     No past surgical history on file.  Family History  Family history unknown: Yes    Social History   Socioeconomic History  . Marital status: Single    Spouse name: Not on file  . Number of children: Not on file  . Years of education: Not on file  . Highest education level: Not on file  Occupational History  . Not on file  Tobacco Use  . Smoking status: Current Every Day Smoker  . Smokeless tobacco: Current User  Substance and Sexual Activity  . Alcohol use: Not Currently  . Drug use: Not Currently  . Sexual activity: Not Currently  Other Topics Concern  . Not on file  Social History Narrative  . Not on file   Social Determinants of Health   Financial Resource Strain: Not on file  Food Insecurity: Not on file  Transportation Needs: Not on file  Physical Activity: Not on file  Stress: Not on file  Social Connections: Not on file  Intimate Partner Violence: Not on file    Outpatient Medications Prior to Visit  Medication Sig Dispense Refill  . albuterol (VENTOLIN HFA) 108 (90 Base) MCG/ACT inhaler Inhale 1-2 puffs into the lungs every 4 (four) hours as needed for wheezing or shortness of breath.    . carvedilol (COREG) 3.125 MG tablet Take 1 tablet (3.125 mg total) by mouth 2 (two) times daily. 180 tablet 1  . CONTOUR NEXT TEST test strip SMARTSIG:Via Meter    . folic acid (FOLVITE) 1 MG tablet  Take 1 mg by mouth daily.    . furosemide (LASIX) 40 MG tablet Take 1 tablet (40 mg total) by mouth 2 (two) times daily. 180 tablet 1  . lactulose (CHRONULAC) 10 GM/15ML solution take 30 milliliters by mouth three times a day (Patient taking differently: Take 20 g by mouth 3 (three) times daily.) 1892 mL 3  . spironolactone (ALDACTONE) 100 MG tablet Take 1 tablet (100 mg total) by mouth daily. 90 tablet 1  . XIFAXAN 550 MG TABS tablet Take 1 tablet (550 mg total) by mouth 2 (two) times daily. 60 tablet 1  . zinc sulfate 220 (50 Zn) MG capsule Take 1 capsule (220 mg total) by mouth daily. 90 capsule 1  . pantoprazole (PROTONIX) 40 MG tablet Take 1 tablet (40 mg total) by mouth 2 (two) times daily. 90 tablet 1   No facility-administered medications prior to visit.    Allergies  Allergen Reactions  . Ace Inhibitors Swelling    Tongue swelling  . Other Hives    Figs cause hives    ROS Review of Systems  Gastrointestinal: Positive for heartburn.  All other systems reviewed and are negative.     Objective:    Physical Exam  BP 119/82 (BP Location: Right Arm, Patient Position: Sitting, Cuff Size: Large)   Pulse 77   Temp (!) 97.3 F (36.3  C) (Temporal)   Ht 5\' 9"  (1.753 m)   Wt (!) 318 lb 12.8 oz (144.6 kg)   SpO2 97%   BMI 47.08 kg/m  Wt Readings from Last 3 Encounters:  11/14/20 (!) 318 lb 12.8 oz (144.6 kg)  10/24/20 (!) 323 lb 3.2 oz (146.6 kg)   BMI 47.08 kg/m  General Appearance: Well nourished, in no apparent distress. Eyes: PERRLA, EOMs, conjunctiva no swelling or erythema Sinuses: No Frontal/maxillary tenderness ENT/Mouth: Ext aud canals clear, TMs without erythema, bulging.  Hearing normal.  Neck: Supple, thyroid normal.  Respiratory: Respiratory effort normal, BS equal bilaterally without rales, rhonchi, wheezing or stridor.  Cardio: RRR with no MRGs. Brisk peripheral pulses without edema.  Abdomen: Soft, + BS.  Non tender, no guarding, rebound, hernias,  masses. Lymphatics: Non tender without lymphadenopathy.  Musculoskeletal: Full ROM, 5/5 strength, normal gait.  Skin: Warm, dry without rashes, lesions, ecchymosis.  Neuro: Cranial nerves intact. Normal muscle tone, no cerebellar symptoms. Sensation intact.  Psych: Awake and oriented X 3, normal affect, Insight and Judgment appropriate.       Health Maintenance Due  Topic Date Due  . Hepatitis C Screening  Never done  . COVID-19 Vaccine (1) Never done  . HIV Screening  Never done  . COLONOSCOPY (Pts 45-33yrs Insurance coverage will need to be confirmed)  Never done    There are no preventive care reminders to display for this patient.  No results found for: TSH Lab Results  Component Value Date   WBC 7.7 11/02/2020   HGB 15.5 11/02/2020   HCT 44.8 11/02/2020   MCV 98.2 11/02/2020   PLT 84 (L) 11/02/2020   Lab Results  Component Value Date   NA 135 11/02/2020   K 4.5 11/02/2020   CO2 25 11/02/2020   GLUCOSE 109 (H) 11/02/2020   BUN 12 11/02/2020   CREATININE 1.00 11/02/2020   BILITOT 4.7 (H) 11/02/2020   ALKPHOS 198 (H) 11/02/2020   AST 111 (H) 11/02/2020   ALT 74 (H) 11/02/2020   PROT 7.3 11/02/2020   ALBUMIN 2.5 (L) 11/02/2020   CALCIUM 8.9 11/02/2020   ANIONGAP 7 11/02/2020   Lab Results  Component Value Date   CHOL 195 10/26/2020   Lab Results  Component Value Date   HDL 40 10/26/2020   Lab Results  Component Value Date   LDLCALC 118 (H) 10/26/2020   Lab Results  Component Value Date   TRIG 208 (H) 10/26/2020   Lab Results  Component Value Date   CHOLHDL 4.9 10/26/2020   No results found for: HGBA1C    Assessment & Plan:  Angel Webster was seen today for hospitalization follow-up.  Diagnoses and all orders for this visit:  Gastroesophageal reflux disease without esophagitis Discussed eating small frequent meal, reduction in acidic foods, fried foods ,spicy foods, alcohol caffeine and tobacco and certain medications. Avoid laying down after  eating 12mins-1hour, elevated head of the bed.  -     pantoprazole (PROTONIX) 40 MG tablet; Take 1 tablet (40 mg total) by mouth 2 (two) times daily.    Meds ordered this encounter  Medications  . pantoprazole (PROTONIX) 40 MG tablet    Sig: Take 1 tablet (40 mg total) by mouth 2 (two) times daily.    Dispense:  180 tablet    Refill:  1    Follow-up: No follow-ups on file.    31m, NP

## 2020-11-26 ENCOUNTER — Telehealth: Payer: Self-pay

## 2020-11-26 NOTE — Telephone Encounter (Signed)
[facility] PA APPROVED Tanna Savoy 11/21/21

## 2020-11-26 NOTE — Telephone Encounter (Signed)
Patient is aware that he needs to contact pharmacy.

## 2020-11-26 NOTE — Telephone Encounter (Signed)
Pt called to see if he can Pick up his Rx for XIFAXAN 550 MG TABS tablet From [facility] since the PA was approved / please advise

## 2020-12-24 ENCOUNTER — Telehealth: Payer: Self-pay

## 2020-12-24 NOTE — Telephone Encounter (Signed)
Copied from CRM [id number]. Topic: General - Other >> Dec 24, 2020  1:36 PM Tamela Oddi wrote: Reason for CRM: Patient would like some information on how he can get the [facility] transportation to and from his doctors appts.  He stated that someone told him that his PCP would have to fill out a form.  Please advise and call patient to discuss at [phone]

## 2020-12-24 NOTE — Telephone Encounter (Signed)
Patient aware that he needs to contact [facility] and get the application and bring to office.

## 2021-01-02 ENCOUNTER — Telehealth (INDEPENDENT_AMBULATORY_CARE_PROVIDER_SITE_OTHER): Payer: Self-pay | Admitting: Primary Care

## 2021-01-02 NOTE — Telephone Encounter (Signed)
Toniann Fail at [facility] calling to advise the t is calling them stating they should have referral for his cirrhosis.  They do not have anything except referral for dermatology. Toniann Fail wanted to make sure we did not miss anything.  But I advised her no, we did not send referral for his cirrhosis. Please call pt to clarify.

## 2021-01-02 NOTE — Telephone Encounter (Signed)
Pt called back and still had some questions regarding his referral. Please advise.

## 2021-01-02 NOTE — Telephone Encounter (Signed)
Patient is aware that referral for cirrhosis was sent to [facility].

## 2021-01-12 ENCOUNTER — Other Ambulatory Visit (INDEPENDENT_AMBULATORY_CARE_PROVIDER_SITE_OTHER): Payer: Self-pay | Admitting: Primary Care

## 2021-01-12 DIAGNOSIS — K7030 Alcoholic cirrhosis of liver without ascites: Secondary | ICD-10-CM

## 2021-01-12 NOTE — Telephone Encounter (Signed)
Requested medication (s) are due for refill today: yes  Requested medication (s) are on the active medication list: yes  Last refill:  10/24/20  Future visit scheduled: yes  Notes to clinic:  Med not assigned to a protocol   Requested Prescriptions  Pending Prescriptions Disp Refills   XIFAXAN 550 MG TABS tablet [Pharmacy Med Name: Xifaxan 550 MG Oral Tablet] 60 tablet 0    Sig: Take 1 tablet by mouth twice daily      Off-Protocol Failed - 01/12/2021 12:52 PM      Failed - Medication not assigned to a protocol, review manually.      Passed - Valid encounter within last 12 months    Recent Outpatient Visits           1 month ago Gastroesophageal reflux disease without esophagitis   [facility] Grayce Sessions, NP   2 months ago Encounter to establish care   [facility] Grayce Sessions, NP       Future Appointments             In 1 week Randa Evens Kinnie Scales, NP [facility]

## 2021-01-22 ENCOUNTER — Other Ambulatory Visit: Payer: Self-pay

## 2021-01-22 ENCOUNTER — Encounter (INDEPENDENT_AMBULATORY_CARE_PROVIDER_SITE_OTHER): Payer: Self-pay | Admitting: Primary Care

## 2021-01-22 ENCOUNTER — Ambulatory Visit (INDEPENDENT_AMBULATORY_CARE_PROVIDER_SITE_OTHER): Payer: Medicaid Other | Admitting: Primary Care

## 2021-01-22 VITALS — BP 118/84 | HR 83 | Temp 97.3°F | Ht 69.0 in | Wt 319.2 lb

## 2021-01-22 DIAGNOSIS — K7030 Alcoholic cirrhosis of liver without ascites: Secondary | ICD-10-CM | POA: Diagnosis not present

## 2021-01-22 DIAGNOSIS — I10 Essential (primary) hypertension: Secondary | ICD-10-CM

## 2021-01-22 DIAGNOSIS — R601 Generalized edema: Secondary | ICD-10-CM

## 2021-01-22 DIAGNOSIS — Z720 Tobacco use: Secondary | ICD-10-CM | POA: Diagnosis not present

## 2021-01-22 MED ORDER — SPIRONOLACTONE 100 MG PO TABS: 100.0000 mg | ORAL_TABLET | Freq: Every day | ORAL | 1 refills | Status: DC

## 2021-01-22 MED ORDER — ZINC SULFATE 220 (50 ZN) MG PO CAPS: 220.0000 mg | ORAL_CAPSULE | Freq: Every day | ORAL | 1 refills | Status: AC

## 2021-01-22 MED ORDER — FUROSEMIDE 40 MG PO TABS: 40.0000 mg | ORAL_TABLET | Freq: Two times a day (BID) | ORAL | 1 refills | Status: DC

## 2021-01-22 MED ORDER — CARVEDILOL 3.125 MG PO TABS: 3.1250 mg | ORAL_TABLET | Freq: Two times a day (BID) | ORAL | 1 refills | Status: DC

## 2021-01-22 NOTE — Patient Instructions (Signed)
 Hypertension, Adult High blood pressure (hypertension) is when the force of blood pumping through the arteries is too strong. The arteries are the blood vessels that carry blood from the heart throughout the body. Hypertension forces the heart to work harder to pump blood and may cause arteries to become narrow or stiff. Untreated or uncontrolled hypertension can cause a heart attack, heart failure, a stroke, kidney disease, and other problems. A blood pressure reading consists of a higher number over a lower number. Ideally, your blood pressure should be below 120/80. The first ("top") number is called the systolic pressure. It is a measure of the pressure in your arteries as your heart beats. The second ("bottom") number is called the diastolic pressure. It is a measure of the pressure in your arteries as the heart relaxes. What are the causes? The exact cause of this condition is not known. There are some conditions that result in or are related to high blood pressure. What increases the risk? Some risk factors for high blood pressure are under your control. The following factors may make you more likely to develop this condition:  Smoking.  Having type 2 diabetes mellitus, high cholesterol, or both.  Not getting enough exercise or physical activity.  Being overweight.  Having too much fat, sugar, calories, or salt (sodium) in your diet.  Drinking too much alcohol. Some risk factors for high blood pressure may be difficult or impossible to change. Some of these factors include:  Having chronic kidney disease.  Having a family history of high blood pressure.  Age. Risk increases with age.  Race. You may be at higher risk if you are African American.  Gender. Men are at higher risk than women before age 45. After age 65, women are at higher risk than men.  Having obstructive sleep apnea.  Stress. What are the signs or symptoms? High blood pressure may not cause symptoms. Very  high blood pressure (hypertensive crisis) may cause:  Headache.  Anxiety.  Shortness of breath.  Nosebleed.  Nausea and vomiting.  Vision changes.  Severe chest pain.  Seizures. How is this diagnosed? This condition is diagnosed by measuring your blood pressure while you are seated, with your arm resting on a flat surface, your legs uncrossed, and your feet flat on the floor. The cuff of the blood pressure monitor will be placed directly against the skin of your upper arm at the level of your heart. It should be measured at least twice using the same arm. Certain conditions can cause a difference in blood pressure between your right and left arms. Certain factors can cause blood pressure readings to be lower or higher than normal for a short period of time:  When your blood pressure is higher when you are in a health care provider's office than when you are at home, this is called white coat hypertension. Most people with this condition do not need medicines.  When your blood pressure is higher at home than when you are in a health care provider's office, this is called masked hypertension. Most people with this condition may need medicines to control blood pressure. If you have a high blood pressure reading during one visit or you have normal blood pressure with other risk factors, you may be asked to:  Return on a different day to have your blood pressure checked again.  Monitor your blood pressure at home for 1 week or longer. If you are diagnosed with hypertension, you may have other blood   or imaging tests to help your health care provider understand your overall risk for other conditions. How is this treated? This condition is treated by making healthy lifestyle changes, such as eating healthy foods, exercising more, and reducing your alcohol intake. Your health care provider may prescribe medicine if lifestyle changes are not enough to get your blood pressure under control, and  if:  Your systolic blood pressure is above 130.  Your diastolic blood pressure is above 80. Your personal target blood pressure may vary depending on your medical conditions, your age, and other factors. Follow these instructions at home: Eating and drinking  Eat a diet that is high in fiber and potassium, and low in sodium, added sugar, and fat. An example eating plan is called the DASH (Dietary Approaches to Stop Hypertension) diet. To eat this way: ? Eat plenty of fresh fruits and vegetables. Try to fill one half of your plate at each meal with fruits and vegetables. ? Eat whole grains, such as whole-wheat pasta, brown rice, or whole-grain bread. Fill about one fourth of your plate with whole grains. ? Eat or drink low-fat dairy products, such as skim milk or low-fat yogurt. ? Avoid fatty cuts of meat, processed or cured meats, and poultry with skin. Fill about one fourth of your plate with lean proteins, such as fish, chicken without skin, beans, eggs, or tofu. ? Avoid pre-made and processed foods. These tend to be higher in sodium, added sugar, and fat.  Reduce your daily sodium intake. Most people with hypertension should eat less than 1,500 mg of sodium a day.  Do not drink alcohol if: ? Your health care provider tells you not to drink. ? You are pregnant, may be pregnant, or are planning to become pregnant.  If you drink alcohol: ? Limit how much you use to:  0-1 drink a day for women.  0-2 drinks a day for men. ? Be aware of how much alcohol is in your drink. In the [place] one drink equals one 12 oz bottle of beer (355 mL), one 5 oz glass of wine (148 mL), or one 1 oz glass of hard liquor (44 mL).   Lifestyle  Work with your health care provider to maintain a healthy body weight or to lose weight. Ask what an ideal weight is for you.  Get at least 30 minutes of exercise most days of the week. Activities may include walking, swimming, or biking.  Include exercise to  strengthen your muscles (resistance exercise), such as Pilates or lifting weights, as part of your weekly exercise routine. Try to do these types of exercises for 30 minutes at least 3 days a week.  Do not use any products that contain nicotine or tobacco, such as cigarettes, e-cigarettes, and chewing tobacco. If you need help quitting, ask your health care provider.  Monitor your blood pressure at home as told by your health care provider.  Keep all follow-up visits as told by your health care provider. This is important.   Medicines  Take over-the-counter and prescription medicines only as told by your health care provider. Follow directions carefully. Blood pressure medicines must be taken as prescribed.  Do not skip doses of blood pressure medicine. Doing this puts you at risk for problems and can make the medicine less effective.  Ask your health care provider about side effects or reactions to medicines that you should watch for. Contact a health care provider if you:  Think you are having a reaction to   a medicine you are taking.  Have headaches that keep coming back (recurring).  Feel dizzy.  Have swelling in your ankles.  Have trouble with your vision. Get help right away if you:  Develop a severe headache or confusion.  Have unusual weakness or numbness.  Feel faint.  Have severe pain in your chest or abdomen.  Vomit repeatedly.  Have trouble breathing. Summary  Hypertension is when the force of blood pumping through your arteries is too strong. If this condition is not controlled, it may put you at risk for serious complications.  Your personal target blood pressure may vary depending on your medical conditions, your age, and other factors. For most people, a normal blood pressure is less than 120/80.  Hypertension is treated with lifestyle changes, medicines, or a combination of both. Lifestyle changes include losing weight, eating a healthy, low-sodium diet,  exercising more, and limiting alcohol. This information is not intended to replace advice given to you by your health care provider. Make sure you discuss any questions you have with your health care provider. Document Revised: 05/26/2018 Document Reviewed: 05/26/2018 Elsevier Patient Education  2021 Elsevier Inc.   Low-Sodium Eating Plan Sodium, which is an element that makes up salt, helps you maintain a healthy balance of fluids in your body. Too much sodium can increase your blood pressure and cause fluid and waste to be held in your body. Your health care provider or dietitian may recommend following this plan if you have high blood pressure (hypertension), kidney disease, liver disease, or heart failure. Eating less sodium can help lower your blood pressure, reduce swelling, and protect your heart, liver, and kidneys. What are tips for following this plan? Reading food labels  The Nutrition Facts label lists the amount of sodium in one serving of the food. If you eat more than one serving, you must multiply the listed amount of sodium by the number of servings.  Choose foods with less than 140 mg of sodium per serving.  Avoid foods with 300 mg of sodium or more per serving. Shopping  Look for lower-sodium products, often labeled as "low-sodium" or "no salt added."  Always check the sodium content, even if foods are labeled as "unsalted" or "no salt added."  Buy fresh foods. ? Avoid canned foods and pre-made or frozen meals. ? Avoid canned, cured, or processed meats.  Buy breads that have less than 80 mg of sodium per slice.   Cooking  Eat more home-cooked food and less restaurant, buffet, and fast food.  Avoid adding salt when cooking. Use salt-free seasonings or herbs instead of table salt or sea salt. Check with your health care provider or pharmacist before using salt substitutes.  Cook with plant-based oils, such as canola, sunflower, or olive oil.   Meal  planning  When eating at a restaurant, ask that your food be prepared with less salt or no salt, if possible. Avoid dishes labeled as brined, pickled, cured, smoked, or made with soy sauce, miso, or teriyaki sauce.  Avoid foods that contain MSG (monosodium glutamate). MSG is sometimes added to [place] food, bouillon, and some canned foods.  Make meals that can be grilled, baked, poached, roasted, or steamed. These are generally made with less sodium. General information Most people on this plan should limit their sodium intake to 1,500-2,000 mg (milligrams) of sodium each day. What foods should I eat? Fruits Fresh, frozen, or canned fruit. Fruit juice. Vegetables Fresh or frozen vegetables. "No salt added" canned vegetables. "No  salt added" tomato sauce and paste. Low-sodium or reduced-sodium tomato and vegetable juice. Grains Low-sodium cereals, including oats, puffed wheat and rice, and shredded wheat. Low-sodium crackers. Unsalted rice. Unsalted pasta. Low-sodium bread. Whole-grain breads and whole-grain pasta. Meats and other proteins Fresh or frozen (no salt added) meat, poultry, seafood, and fish. Low-sodium canned tuna and salmon. Unsalted nuts. Dried peas, beans, and lentils without added salt. Unsalted canned beans. Eggs. Unsalted nut butters. Dairy Milk. Soy milk. Cheese that is naturally low in sodium, such as ricotta cheese, fresh mozzarella, or Swiss cheese. Low-sodium or reduced-sodium cheese. Cream cheese. Yogurt. Seasonings and condiments Fresh and dried herbs and spices. Salt-free seasonings. Low-sodium mustard and ketchup. Sodium-free salad dressing. Sodium-free light mayonnaise. Fresh or refrigerated horseradish. Lemon juice. Vinegar. Other foods Homemade, reduced-sodium, or low-sodium soups. Unsalted popcorn and pretzels. Low-salt or salt-free chips. The items listed above may not be a complete list of foods and beverages you can eat. Contact a dietitian for more  information. What foods should I avoid? Vegetables Sauerkraut, pickled vegetables, and relishes. Olives. [place] fries. Onion rings. Regular canned vegetables (not low-sodium or reduced-sodium). Regular canned tomato sauce and paste (not low-sodium or reduced-sodium). Regular tomato and vegetable juice (not low-sodium or reduced-sodium). Frozen vegetables in sauces. Grains Instant hot cereals. Bread stuffing, pancake, and biscuit mixes. Croutons. Seasoned rice or pasta mixes. Noodle soup cups. Boxed or frozen macaroni and cheese. Regular salted crackers. Self-rising flour. Meats and other proteins Meat or fish that is salted, canned, smoked, spiced, or pickled. Precooked or cured meat, such as sausages or meat loaves. Tomasa Blase. Ham. Pepperoni. Hot dogs. Corned beef. Chipped beef. Salt pork. Jerky. Pickled herring. Anchovies and sardines. Regular canned tuna. Salted nuts. Dairy Processed cheese and cheese spreads. Hard cheeses. Cheese curds. Blue cheese. Feta cheese. String cheese. Regular cottage cheese. Buttermilk. Canned milk. Fats and oils Salted butter. Regular margarine. Ghee. Bacon fat. Seasonings and condiments Onion salt, garlic salt, seasoned salt, table salt, and sea salt. Canned and packaged gravies. Worcestershire sauce. Tartar sauce. Barbecue sauce. Teriyaki sauce. Soy sauce, including reduced-sodium. Steak sauce. Fish sauce. Oyster sauce. Cocktail sauce. Horseradish that you find on the shelf. Regular ketchup and mustard. Meat flavorings and tenderizers. Bouillon cubes. Hot sauce. Pre-made or packaged marinades. Pre-made or packaged taco seasonings. Relishes. Regular salad dressings. Salsa. Other foods Salted popcorn and pretzels. Corn chips and puffs. Potato and tortilla chips. Canned or dried soups. Pizza. Frozen entrees and pot pies. The items listed above may not be a complete list of foods and beverages you should avoid. Contact a dietitian for more information. Summary  Eating less  sodium can help lower your blood pressure, reduce swelling, and protect your heart, liver, and kidneys.  Most people on this plan should limit their sodium intake to 1,500-2,000 mg (milligrams) of sodium each day.  Canned, boxed, and frozen foods are high in sodium. Restaurant foods, fast foods, and pizza are also very high in sodium. You also get sodium by adding salt to food.  Try to cook at home, eat more fresh fruits and vegetables, and eat less fast food and canned, processed, or prepared foods. This information is not intended to replace advice given to you by your health care provider. Make sure you discuss any questions you have with your health care provider. Document Revised: 10/21/2019 Document Reviewed: 08/17/2019 Elsevier Patient Education  2021 [employer].

## 2021-01-22 NOTE — Progress Notes (Signed)
Subjective:    Patient here for follow-up of elevated blood pressure.  He is exercising and is adherent to a low-salt diet.  Blood pressure is well controlled at home. Cardiac symptoms: none. Patient denies: none. Cardiovascular risk factors: advanced age (older than 79 for men, 1 for women), hypertension, male gender, obesity (BMI >= 30 kg/m2) and smoking/ tobacco exposure. Use of agents associated with hypertension: none. History of target organ damage: none.  The following portions of the patient's history were reviewed and updated as appropriate: allergies, current medications, past family history, past medical history and problem list.  Past Medical History:  Diagnosis Date  . Asthma   . Hypertension    Current Outpatient Medications on File Prior to Visit  Medication Sig Dispense Refill  . albuterol (VENTOLIN HFA) 108 (90 Base) MCG/ACT inhaler Inhale 1-2 puffs into the lungs every 4 (four) hours as needed for wheezing or shortness of breath.    . CONTOUR NEXT TEST test strip SMARTSIG:Via Meter    . folic acid (FOLVITE) 1 MG tablet Take 1 mg by mouth daily.    Marland Kitchen lactulose (CHRONULAC) 10 GM/15ML solution take 30 milliliters by mouth three times a day (Patient taking differently: Take 20 g by mouth 3 (three) times daily.) 1892 mL 3  . pantoprazole (PROTONIX) 40 MG tablet Take 1 tablet (40 mg total) by mouth 2 (two) times daily. 180 tablet 1  . XIFAXAN 550 MG TABS tablet Take 1 tablet by mouth twice daily 60 tablet 0   No current facility-administered medications on file prior to visit.    Review of Systems A comprehensive review of systems was negative.     Objective:     BP 118/84 (BP Location: Right Arm, Patient Position: Sitting, Cuff Size: Large)   Pulse 83   Temp (!) 97.3 F (36.3 C) (Temporal)   Ht 5\' 9"  (1.753 m)   Wt (!) 319 lb 3.2 oz (144.8 kg)   SpO2 95%   BMI 47.14 kg/m   Assessment:  General: Vital signs reviewed.  Patient is well-developed and well-nourished,  in no acute distress and cooperative with exam.  Head: Normocephalic and atraumatic.morbid obese male  Eyes: EOMI, conjunctivae normal, Neck: Supple, thick  trachea midline, normal ROM, no JVD, masses, thyromegaly, or carotid bruit present.  Cardiovascular: RRR, S1 normal, S2 normal, no murmurs, gallops, or rubs. Pulmonary/Chest: Clear to auscultation bilaterally, no wheezes, rales, or rhonchi. Abdominal: Soft, non-tender, non-distended, BS + Musculoskeletal: No joint deformities, erythema, or stiffness, ROM full and nontender. Extremities: No lower extremity edema bilaterally,  pulses symmetric and intact bilaterally. No cyanosis or clubbing. Neurological: A&O x3, Strength is normal and symmetric bilaterally Skin: Warm, dry and intact. No rashes or erythema. (vitiligo) Psychiatric: Normal mood and affect. speech and behavior is normal. Cognition and memory are normal.   Hypertension, normal blood pressure 118/84. Evidence of target organ damage: none.    Plan:  Sergi was seen today for hypertension and gastroesophageal reflux.  Diagnoses and all orders for this visit:  Essential hypertension Blood pressure is at goal of less than 130/80, low-sodium, DASH diet, medication compliance, 150 minutes of moderate intensity exercise per week. Discussed medication compliance, adverse effects. -     carvedilol (COREG) 3.125 MG tablet; Take 1 tablet (3.125 mg total) by mouth 2 (two) times daily.  Alcoholic cirrhosis of liver without ascites (HCC) -     spironolactone (ALDACTONE) 100 MG tablet; Take 1 tablet (100 mg total) by mouth daily. -  furosemide (LASIX) 40 MG tablet; Take 1 tablet (40 mg total) by mouth 2 (two) times daily.  Generalized edema -     spironolactone (ALDACTONE) 100 MG tablet; Take 1 tablet (100 mg total) by mouth daily. -     furosemide (LASIX) 40 MG tablet; Take 1 tablet (40 mg total) by mouth 2 (two) times daily.  Tobacco abuse Discussed cessation will at every visit.  He is trying to wean self off  Made aware of risk factors.  Other orders -     zinc sulfate 220 (50 Zn) MG capsule; Take 1 capsule (220 mg total) by mouth daily.    Medication: no change.   Grayce Sessions

## 2021-01-27 ENCOUNTER — Other Ambulatory Visit: Payer: Self-pay

## 2021-01-27 ENCOUNTER — Encounter (HOSPITAL_COMMUNITY): Payer: Self-pay | Admitting: Emergency Medicine

## 2021-01-27 ENCOUNTER — Emergency Department (HOSPITAL_COMMUNITY)
Admission: EM | Admit: 2021-01-27 | Discharge: 2021-01-28 | Disposition: A | Discharge status: Home / Self Care | Payer: Medicaid Other | Attending: Emergency Medicine | Admitting: Emergency Medicine

## 2021-01-27 DIAGNOSIS — L80 Vitiligo: Secondary | ICD-10-CM | POA: Insufficient documentation

## 2021-01-27 DIAGNOSIS — F17200 Nicotine dependence, unspecified, uncomplicated: Secondary | ICD-10-CM | POA: Diagnosis not present

## 2021-01-27 DIAGNOSIS — Z760 Encounter for issue of repeat prescription: Secondary | ICD-10-CM

## 2021-01-27 DIAGNOSIS — Z79899 Other long term (current) drug therapy: Secondary | ICD-10-CM | POA: Insufficient documentation

## 2021-01-27 DIAGNOSIS — J45909 Unspecified asthma, uncomplicated: Secondary | ICD-10-CM | POA: Insufficient documentation

## 2021-01-27 DIAGNOSIS — I10 Essential (primary) hypertension: Secondary | ICD-10-CM | POA: Diagnosis not present

## 2021-01-27 MED ORDER — LACTULOSE 10 GM/15ML PO SOLN
20.0000 g | Freq: Once | ORAL | Status: AC
  Administered 2021-01-28: 20 g via ORAL
  Filled 2021-01-27: qty 30

## 2021-01-27 MED ORDER — RIFAXIMIN 550 MG PO TABS
550.0000 mg | ORAL_TABLET | Freq: Once | ORAL | Status: AC
  Administered 2021-01-28: 550 mg via ORAL
  Filled 2021-01-27: qty 1

## 2021-01-27 MED ORDER — CARVEDILOL 3.125 MG PO TABS
3.1250 mg | ORAL_TABLET | Freq: Once | ORAL | Status: AC
  Administered 2021-01-28: 3.125 mg via ORAL
  Filled 2021-01-27: qty 1

## 2021-01-27 MED ORDER — PANTOPRAZOLE SODIUM 40 MG PO TBEC
40.0000 mg | DELAYED_RELEASE_TABLET | Freq: Once | ORAL | Status: AC
  Administered 2021-01-28: 40 mg via ORAL
  Filled 2021-01-27: qty 1

## 2021-01-27 MED ORDER — FUROSEMIDE 20 MG PO TABS
40.0000 mg | ORAL_TABLET | Freq: Once | ORAL | Status: AC
  Administered 2021-01-28: 40 mg via ORAL
  Filled 2021-01-27: qty 2

## 2021-01-27 NOTE — ED Provider Notes (Signed)
[facility] EMERGENCY DEPARTMENT Provider Note   CSN: [id number] Arrival date & time: 01/27/21  2125     History Chief Complaint  Patient presents with  . Medication Refill    Angel Webster is a 60 y.o. male.  HPI Patient states that he is here to get his medication.  States that he is watching [place] that he is living from another family member to living somewhere else.  States however he did not take all the stuff from the house.  States that he had his medicines this morning but has not had it since.  States he needs some medicines until he can get to the house to get the rest of his pills.  States he has the pills just not with him.  States tomorrow he thinks he potentially could be able to get there if the place he is going helps him get there.    Past Medical History:  Diagnosis Date  . Asthma   . Hypertension     Patient Active Problem List   Diagnosis Date Noted  . Thrombocytopenia (HCC)   . Alcoholic cirrhosis (HCC)   . Hepatic encephalopathy (HCC) 11/01/2020    History reviewed. No pertinent surgical history.     Family History  Family history unknown: Yes    Social History   Tobacco Use  . Smoking status: Current Every Day Smoker  . Smokeless tobacco: Current User  Substance Use Topics  . Alcohol use: Not Currently  . Drug use: Not Currently    Home Medications Prior to Admission medications   Medication Sig Start Date End Date Taking? Authorizing Provider  albuterol (VENTOLIN HFA) 108 (90 Base) MCG/ACT inhaler Inhale 1-2 puffs into the lungs every 4 (four) hours as needed for wheezing or shortness of breath. 07/04/20   [provider]  carvedilol (COREG) 3.125 MG tablet Take 1 tablet (3.125 mg total) by mouth 2 (two) times daily. 01/22/21   Grayce Sessions, NP  CONTOUR NEXT TEST test strip SMARTSIG:Via Meter 07/17/20   [provider]  folic acid (FOLVITE) 1 MG tablet Take 1 mg by mouth daily. 07/01/20    [provider]  furosemide (LASIX) 40 MG tablet Take 1 tablet (40 mg total) by mouth 2 (two) times daily. 01/22/21   Grayce Sessions, NP  lactulose (CHRONULAC) 10 GM/15ML solution take 30 milliliters by mouth three times a day Patient taking differently: Take 20 g by mouth 3 (three) times daily. 10/24/20   Grayce Sessions, NP  pantoprazole (PROTONIX) 40 MG tablet Take 1 tablet (40 mg total) by mouth 2 (two) times daily. 11/14/20   Grayce Sessions, NP  spironolactone (ALDACTONE) 100 MG tablet Take 1 tablet (100 mg total) by mouth daily. 01/22/21   Grayce Sessions, NP  XIFAXAN 550 MG TABS tablet Take 1 tablet by mouth twice daily 01/17/21   Grayce Sessions, NP  zinc sulfate 220 (50 Zn) MG capsule Take 1 capsule (220 mg total) by mouth daily. 01/22/21   Grayce Sessions, NP    Allergies    Ace inhibitors and Other  Review of Systems   Review of Systems  Constitutional: Negative for appetite change.  Respiratory: Negative for shortness of breath.   Gastrointestinal: Negative for abdominal pain.  Skin: Negative for rash.  Neurological: Negative for weakness.  Psychiatric/Behavioral: Negative for confusion.    Physical Exam Updated Vital Signs BP 107/66 (BP Location: Left Arm)   Pulse 89   Temp Marland Kitchen)  97.3 F (36.3 C) (Oral)   Resp 20   SpO2 100%   Physical Exam Vitals and nursing note reviewed.  HENT:     Head: Atraumatic.  Eyes:     General: No scleral icterus. Abdominal:     Tenderness: There is no abdominal tenderness.  Musculoskeletal:     Cervical back: Neck supple.  Skin:    Capillary Refill: Capillary refill takes less than 2 seconds.     Comments: Vitiligo  Neurological:     Mental Status: He is alert and oriented to person, place, and time.     ED Results / Procedures / Treatments   Labs (all labs ordered are listed, but only abnormal results are displayed) Labs Reviewed - No data to display  EKG None  Radiology No results  found.  Procedures Procedures   Medications Ordered in ED Medications  carvedilol (COREG) tablet 3.125 mg (has no administration in time range)  furosemide (LASIX) tablet 40 mg (has no administration in time range)  lactulose (CHRONULAC) 10 GM/15ML solution 20 g (has no administration in time range)  pantoprazole (PROTONIX) EC tablet 40 mg (has no administration in time range)  rifaximin (XIFAXAN) tablet 550 mg (has no administration in time range)    ED Course  I have reviewed the triage vital signs and the nursing notes.  Pertinent labs & imaging results that were available during my care of the patient were reviewed by me and considered in my medical decision making (see chart for details).    MDM Rules/Calculators/A&P                          Patient presents for medication.  He states that he needs a few pills until he can get back to the rest of his pills.  States he has some just not working get them right now.  However I cannot give the patient pills to go right now.  I will give him the dose of pills that he is missed this evening.  Hopefully patient can go and get transportation to pick up his medicines tomorrow.  Will discharge Final Clinical Impression(s) / ED Diagnoses Final diagnoses:  Medication refill    Rx / DC Orders ED Discharge Orders    None       Benjiman Core, MD 01/27/21 2324

## 2021-01-27 NOTE — ED Triage Notes (Signed)
Pt reports he needs his medications for "my liver."  There has been a disruption in his living situation and he "needs refills until I can't get assistance."  Denies any issues now, "I just don't want any to start."

## 2021-01-28 DIAGNOSIS — Z20822 Contact with and (suspected) exposure to covid-19: Secondary | ICD-10-CM | POA: Diagnosis not present

## 2021-01-28 NOTE — ED Notes (Signed)
Patient given discharge paperwork and instructions. Verbalized understanding of teaching. No IV access. Ambulatory to exit in NAD with steady gait. 

## 2021-01-29 ENCOUNTER — Telehealth: Payer: Self-pay | Admitting: *Deleted

## 2021-01-29 NOTE — Telephone Encounter (Signed)
Transition Care Management Follow-up Telephone Call  Date of discharge and from where: 01/28/2021 - [facility]  How have you been since you were released from the hospital? "Okay"  Any questions or concerns? No  Items Reviewed:  Did the pt receive and understand the discharge instructions provided? Yes   Medications obtained and verified? Yes   Other? No   Any new allergies since your discharge? No   Dietary orders reviewed? No  Do you have support at home? No     Functional Questionnaire: (I = Independent and D = Dependent) ADLs: I  Bathing/Dressing- I  Meal Prep- I  Eating- I  Maintaining continence- I  Transferring/Ambulation- I  Managing Meds- I  Follow up appointments reviewed:   PCP Hospital f/u appt confirmed? No  - has a routine appointment in July  Specialist Hospital f/u appt confirmed? No    Are transportation arrangements needed? No   If their condition worsens, is the pt aware to call PCP or go to the Emergency Dept.? Yes  Was the patient provided with contact information for the PCP's office or ED? Yes  Was to pt encouraged to call back with questions or concerns? Yes

## 2021-02-05 ENCOUNTER — Emergency Department (HOSPITAL_COMMUNITY)
Admission: EM | Admit: 2021-02-05 | Discharge: 2021-02-05 | Disposition: A | Discharge status: Home / Self Care | Payer: Medicaid Other | Attending: Emergency Medicine | Admitting: Emergency Medicine

## 2021-02-05 ENCOUNTER — Other Ambulatory Visit: Payer: Self-pay

## 2021-02-05 ENCOUNTER — Emergency Department (HOSPITAL_COMMUNITY): Payer: Medicaid Other

## 2021-02-05 ENCOUNTER — Encounter (HOSPITAL_COMMUNITY): Payer: Self-pay

## 2021-02-05 DIAGNOSIS — J45909 Unspecified asthma, uncomplicated: Secondary | ICD-10-CM | POA: Diagnosis not present

## 2021-02-05 DIAGNOSIS — R0602 Shortness of breath: Secondary | ICD-10-CM | POA: Diagnosis not present

## 2021-02-05 DIAGNOSIS — R600 Localized edema: Secondary | ICD-10-CM | POA: Diagnosis not present

## 2021-02-05 DIAGNOSIS — I10 Essential (primary) hypertension: Secondary | ICD-10-CM | POA: Insufficient documentation

## 2021-02-05 DIAGNOSIS — Z79899 Other long term (current) drug therapy: Secondary | ICD-10-CM | POA: Insufficient documentation

## 2021-02-05 DIAGNOSIS — J189 Pneumonia, unspecified organism: Secondary | ICD-10-CM | POA: Diagnosis not present

## 2021-02-05 DIAGNOSIS — F17200 Nicotine dependence, unspecified, uncomplicated: Secondary | ICD-10-CM | POA: Insufficient documentation

## 2021-02-05 DIAGNOSIS — R059 Cough, unspecified: Secondary | ICD-10-CM | POA: Diagnosis not present

## 2021-02-05 DIAGNOSIS — M791 Myalgia, unspecified site: Secondary | ICD-10-CM | POA: Insufficient documentation

## 2021-02-05 HISTORY — DX: Unspecified cirrhosis of liver: K74.60

## 2021-02-05 LAB — CBC WITH DIFFERENTIAL/PLATELET
Abs Immature Granulocytes: 0.02 10*3/uL (ref 0.00–0.07)
Basophils Absolute: 0.1 10*3/uL (ref 0.0–0.1)
Basophils Relative: 1 %
Eosinophils Absolute: 0.2 10*3/uL (ref 0.0–0.5)
Eosinophils Relative: 2 %
HCT: 40.3 % (ref 39.0–52.0)
Hemoglobin: 14.1 g/dL (ref 13.0–17.0)
Immature Granulocytes: 0 %
Lymphocytes Relative: 20 %
Lymphs Abs: 2 10*3/uL (ref 0.7–4.0)
MCH: 34.7 pg — ABNORMAL HIGH (ref 26.0–34.0)
MCHC: 35 g/dL (ref 30.0–36.0)
MCV: 99.3 fL (ref 80.0–100.0)
Monocytes Absolute: 1.4 10*3/uL — ABNORMAL HIGH (ref 0.1–1.0)
Monocytes Relative: 15 %
Neutro Abs: 6.1 10*3/uL (ref 1.7–7.7)
Neutrophils Relative %: 62 %
Platelets: 68 10*3/uL — ABNORMAL LOW (ref 150–400)
RBC: 4.06 MIL/uL — ABNORMAL LOW (ref 4.22–5.81)
RDW: 16 % — ABNORMAL HIGH (ref 11.5–15.5)
WBC: 9.8 10*3/uL (ref 4.0–10.5)
nRBC: 0 % (ref 0.0–0.2)

## 2021-02-05 LAB — BRAIN NATRIURETIC PEPTIDE: B Natriuretic Peptide: 21.2 pg/mL (ref 0.0–100.0)

## 2021-02-05 LAB — COMPREHENSIVE METABOLIC PANEL
ALT: 93 U/L — ABNORMAL HIGH (ref 0–44)
AST: 165 U/L — ABNORMAL HIGH (ref 15–41)
Albumin: 2.5 g/dL — ABNORMAL LOW (ref 3.5–5.0)
Alkaline Phosphatase: 206 U/L — ABNORMAL HIGH (ref 38–126)
Anion gap: 7 (ref 5–15)
BUN: 11 mg/dL (ref 6–20)
CO2: 25 mmol/L (ref 22–32)
Calcium: 8.9 mg/dL (ref 8.9–10.3)
Chloride: 100 mmol/L (ref 98–111)
Creatinine, Ser: 1 mg/dL (ref 0.61–1.24)
GFR, Estimated: >60 mL/min (ref >60)
Glucose, Bld: 125 mg/dL — ABNORMAL HIGH (ref 70–99)
Potassium: 5.1 mmol/L (ref 3.5–5.1)
Sodium: 132 mmol/L — ABNORMAL LOW (ref 135–145)
Total Bilirubin: 6.3 mg/dL — ABNORMAL HIGH (ref 0.3–1.2)
Total Protein: 6.5 g/dL (ref 6.5–8.1)

## 2021-02-05 MED ORDER — ALBUTEROL SULFATE HFA 108 (90 BASE) MCG/ACT IN AERS
4.0000 | INHALATION_SPRAY | Freq: Once | RESPIRATORY_TRACT | Status: AC
  Administered 2021-02-05: 4 via RESPIRATORY_TRACT
  Filled 2021-02-05: qty 6.7

## 2021-02-05 MED ORDER — FUROSEMIDE 10 MG/ML IJ SOLN
40.0000 mg | Freq: Once | INTRAMUSCULAR | Status: AC
  Administered 2021-02-05: 40 mg via INTRAVENOUS
  Filled 2021-02-05: qty 4

## 2021-02-05 NOTE — ED Provider Notes (Signed)
[facility] EMERGENCY DEPARTMENT Provider Note   CSN: [id number] Arrival date & time: 02/05/21  0515     History Chief Complaint  Patient presents with  . Shortness of Breath    Angel Webster is a 60 y.o. male.  Patient is a 60 year old male with past medical history of hypertension, liver cirrhosis, asthma.  Patient currently homeless for the past 3-weeks and living on the street.  Patient became cold this evening when the temperature dropped.  He began having difficulty breathing and body aches and presents for evaluation of this.  He does report some cough.  He has had pneumonia in the past and feels as though he may have this again.  He denies fevers or chills.  The history is provided by the patient.  Shortness of Breath Severity:  Moderate Onset quality:  Sudden Timing:  Constant Progression:  Unchanged Chronicity:  New Relieved by:  Nothing Worsened by:  Nothing Ineffective treatments:  None tried      Past Medical History:  Diagnosis Date  . Asthma   . Cirrhosis (HCC)   . Hypertension     Patient Active Problem List   Diagnosis Date Noted  . Thrombocytopenia (HCC)   . Alcoholic cirrhosis (HCC)   . Hepatic encephalopathy (HCC) 11/01/2020    History reviewed. No pertinent surgical history.     Family History  Family history unknown: Yes    Social History   Tobacco Use  . Smoking status: Current Every Day Smoker  . Smokeless tobacco: Current User  Vaping Use  . Vaping Use: Never used  Substance Use Topics  . Alcohol use: Not Currently  . Drug use: Not Currently    Home Medications Prior to Admission medications   Medication Sig Start Date End Date Taking? Authorizing Provider  albuterol (VENTOLIN HFA) 108 (90 Base) MCG/ACT inhaler Inhale 1-2 puffs into the lungs every 4 (four) hours as needed for wheezing or shortness of breath. 07/04/20   [provider]  carvedilol (COREG) 3.125 MG tablet Take 1 tablet (3.125 mg  total) by mouth 2 (two) times daily. 01/22/21   Grayce Sessions, NP  CONTOUR NEXT TEST test strip SMARTSIG:Via Meter 07/17/20   [provider]  folic acid (FOLVITE) 1 MG tablet Take 1 mg by mouth daily. 07/01/20   [provider]  furosemide (LASIX) 40 MG tablet Take 1 tablet (40 mg total) by mouth 2 (two) times daily. 01/22/21   Grayce Sessions, NP  lactulose (CHRONULAC) 10 GM/15ML solution take 30 milliliters by mouth three times a day Patient taking differently: Take 20 g by mouth 3 (three) times daily. 10/24/20   Grayce Sessions, NP  pantoprazole (PROTONIX) 40 MG tablet Take 1 tablet (40 mg total) by mouth 2 (two) times daily. 11/14/20   Grayce Sessions, NP  spironolactone (ALDACTONE) 100 MG tablet Take 1 tablet (100 mg total) by mouth daily. 01/22/21   Grayce Sessions, NP  XIFAXAN 550 MG TABS tablet Take 1 tablet by mouth twice daily 01/17/21   Grayce Sessions, NP  zinc sulfate 220 (50 Zn) MG capsule Take 1 capsule (220 mg total) by mouth daily. 01/22/21   Grayce Sessions, NP    Allergies    Ace inhibitors and Other  Review of Systems   Review of Systems  Respiratory: Positive for shortness of breath.   All other systems reviewed and are negative.   Physical Exam Updated Vital Signs BP 127/74 (BP Location: Left Arm)  Pulse 92   Temp (!) 97.4 F (36.3 C) (Oral)   Resp 17   Ht 5\' 9"  (1.753 m)   Wt 124.7 kg   SpO2 99%   BMI 40.61 kg/m   Physical Exam Vitals and nursing note reviewed.  Constitutional:      General: He is not in acute distress.    Appearance: He is well-developed. He is not diaphoretic.  HENT:     Head: Normocephalic and atraumatic.  Cardiovascular:     Rate and Rhythm: Normal rate and regular rhythm.     Heart sounds: No murmur heard. No friction rub.  Pulmonary:     Effort: Pulmonary effort is normal. No respiratory distress.     Breath sounds: Normal breath sounds. No wheezing or rales.  Abdominal:      General: Bowel sounds are normal. There is no distension.     Palpations: Abdomen is soft.     Tenderness: There is no abdominal tenderness.  Musculoskeletal:        General: Normal range of motion.     Cervical back: Normal range of motion and neck supple.     Right lower leg: Edema present.     Left lower leg: Edema present.     Comments: There is 2+ pitting edema of both lower extremities.  Skin:    General: Skin is warm and dry.  Neurological:     Mental Status: He is alert and oriented to person, place, and time.     Coordination: Coordination normal.     ED Results / Procedures / Treatments   Labs (all labs ordered are listed, but only abnormal results are displayed) Labs Reviewed  COMPREHENSIVE METABOLIC PANEL  CBC WITH DIFFERENTIAL/PLATELET  BRAIN NATRIURETIC PEPTIDE    EKG None  Radiology No results found.  Procedures Procedures   Medications Ordered in ED Medications  albuterol (VENTOLIN HFA) 108 (90 Base) MCG/ACT inhaler 4 puff (has no administration in time range)  furosemide (LASIX) injection 40 mg (has no administration in time range)    ED Course  I have reviewed the triage vital signs and the nursing notes.  Pertinent labs & imaging results that were available during my care of the patient were reviewed by me and considered in my medical decision making (see chart for details).    MDM Rules/Calculators/A&P  Patient is a 60 year old male with history of cirrhosis.  He is currently homeless and presents with difficulty breathing and leg swelling.  He tells me he was sleeping outside last night when these symptoms began.  Work-up has been initiated including laboratory studies and chest x-ray.  He has received a dose of intravenous Lasix and is awaiting diuresis.  Care will be signed out to Dr. 41 at shift change.  He will obtain the results of the laboratory studies, reassess the patient, and determine the final disposition.  Final Clinical  Impression(s) / ED Diagnoses Final diagnoses:  None    Rx / DC Orders ED Discharge Orders    None       Rush Landmark, MD 02/06/21 0425

## 2021-02-05 NOTE — ED Notes (Signed)
Spoke with Thereasa Distance, Shift Commander of EMS, whom went to the address that the patient provided and was unable to located patient.

## 2021-02-05 NOTE — ED Notes (Signed)
Contacted patient with an update. Advised that a member of the [employer], Police Dept., or EMS will come by and remove IV. Also asked patient if he had his paisley bag, and he confirmed that he did but that his "cash card" was missing. Advised patient to re-check his bag, and explained that EMS stated that they did not recover a cash card, nor did [facility] Emergency dept.

## 2021-02-05 NOTE — ED Notes (Signed)
Pt eating and drinking without any complaints.

## 2021-02-05 NOTE — Discharge Planning (Signed)
RNCM consulted regarding transportation needs for pt.  RNCM provided [facility] Transportation as pt has no transportation from hospital to [address] [place], [place].  [place] presented and explained [facility] and Release of Liability Form.  Pt signed waiver, therefore agreeing to written terms.

## 2021-02-05 NOTE — Discharge Instructions (Signed)
Your work-up today was overall reassuring.  Please follow-up with your primary doctor and if any symptoms change or worsen acutely, please return to the nearest emergency department.  Please take your home medications as we discussed.

## 2021-02-05 NOTE — Progress Notes (Signed)
CSW spoke with patient and provided shelter resources. Patient stated he is already on the waiting [facility] for [facility] [employer]. Patient stated he has been sleeping outside of the [facility]. CSW asked patient if he had any friends or family in the area. Patient stated he relocated to this area and was living with his cousin. Patient stated he gets a Radio producer. Patient stated things in the beginning were fine and then his cousin started charging him more money then what it was worth. Patient stated he decided to leave his cousins home because the money he was paying to live with his cousin is not the money he would pay to live on his own. CSW asked patient if there was anywhere he could start off by paying for utilities at his cousins home and work his way up to get back on his feet. Patient stated he plans on speaking with his cousin to make another arrangement.

## 2021-02-05 NOTE — ED Provider Notes (Signed)
7:32 AM Care assumed from Dr. Judd Lien.  At time of transfer of care, patient is awaiting results of BNP and reassessment for transient shortness of breath in the setting of being outside in the cold tonight.  If work-up is reassuring, anticipate discharge home.  Patient's work-up began to return.  His LFTs are elevated slightly more than prior which we discussed.  CBC shows low platelets similar to prior but no leukocytosis or anemia.  Chest x-ray shows no acute abnormality and BNP is not elevated.  Has gust with patient and he is doing well on room air.  He ambulated without hypoxia on room air.  Patient will be given food and drink and we will have the case management/social work team see him to discuss resources available given his current homeless living status.  If patient is feeling better, anticipate discharge home.  Patient was able to eat and drink well difficulty and is feeling better.  Case management/social work saw him and provided him with resources and a cab here past.  He will follow-up with PCP and take his medications.  He agreed with return precautions and plan of care and was discharged in good condition with reassuring vital signs.  Clinical Impression: 1. SOB (shortness of breath)     Disposition: Discharge  Condition: Good  I have discussed the results, Dx and Tx plan with the pt(& family if present). He/she/they expressed understanding and agree(s) with the plan. Discharge instructions discussed at great length. Strict return precautions discussed and pt &/or family have verbalized understanding of the instructions. No further questions at time of discharge.    New Prescriptions   No medications on file    Follow Up: Grayce Sessions, NP [address] [place] [address] [phone]     [facility] EMERGENCY DEPARTMENT [address] [id number] mc [place] [address] [phone]       Gaile Allmon,  Canary Brim, MD 02/05/21 1434

## 2021-02-05 NOTE — ED Notes (Signed)
Patient verbalizes understanding of discharge instructions. Opportunity for questioning and answers were provided. Armband removed by staff, pt discharged from ED via wheelchair to wait in lobby for uber to go home.

## 2021-02-05 NOTE — ED Triage Notes (Signed)
Pt homeless. C/O shortness of breath X30 minutes "after I got cold" C/O "hurting all over" Groin is raw in the folds.

## 2021-02-05 NOTE — ED Notes (Signed)
Pt was able to ambulate slowly around the room due to inner thighs hurting while I changed the linings on the bed  o2 remaining at 97.

## 2021-02-05 NOTE — ED Notes (Signed)
Patient contacted our facility and states that his IV was left in and that his bag of belongings was left on the ambulance this a.m. when dropped off at our facility. Contacted [place] non-emergency dept and spoke with EMS supervisor whom stated that a "purple, paisley, rolling bag was left with patient at the hospital", spoke with non-emergent communications to request that they see patient and removed IV. Pt states that he is has [address], "in front of the apartment building on a bench". Patient contacted with updated information.

## 2021-02-06 ENCOUNTER — Telehealth: Payer: Self-pay | Admitting: *Deleted

## 2021-02-06 NOTE — Telephone Encounter (Signed)
Transition Care Management Unsuccessful Follow-up Telephone Call  Date of discharge and from where:  02/05/2021 [facility]  Attempts:  1st Attempt  Reason for unsuccessful TCM follow-up call:  Left voice message

## 2021-02-07 NOTE — Telephone Encounter (Signed)
Transition Care Management Unsuccessful Follow-up Telephone Call  Date of discharge and from where:  02/05/2021 from [facility]  Attempts:  2nd Attempt  Reason for unsuccessful TCM follow-up call:  Left voice message

## 2021-02-08 NOTE — Telephone Encounter (Signed)
Transition Care Management Unsuccessful Follow-up Telephone Call  Date of discharge and from where:  02/05/2021 from [facility]  Attempts:  3rd Attempt  Reason for unsuccessful TCM follow-up call:  Unable to reach patient

## 2021-02-16 ENCOUNTER — Inpatient Hospital Stay (HOSPITAL_COMMUNITY)
Admission: EM | Admit: 2021-02-16 | Discharge: 2021-02-27 | DRG: 433 | Disposition: A | Discharge status: Home / Self Care | Payer: Medicaid Other | Attending: Internal Medicine | Admitting: Internal Medicine

## 2021-02-16 ENCOUNTER — Encounter (HOSPITAL_COMMUNITY): Payer: Self-pay | Admitting: Emergency Medicine

## 2021-02-16 ENCOUNTER — Other Ambulatory Visit: Payer: Self-pay

## 2021-02-16 DIAGNOSIS — K7290 Hepatic failure, unspecified without coma: Principal | ICD-10-CM

## 2021-02-16 DIAGNOSIS — L03114 Cellulitis of left upper limb: Secondary | ICD-10-CM | POA: Diagnosis present

## 2021-02-16 DIAGNOSIS — L03113 Cellulitis of right upper limb: Secondary | ICD-10-CM | POA: Diagnosis present

## 2021-02-16 DIAGNOSIS — Z560 Unemployment, unspecified: Secondary | ICD-10-CM

## 2021-02-16 DIAGNOSIS — L0390 Cellulitis, unspecified: Secondary | ICD-10-CM

## 2021-02-16 DIAGNOSIS — K7682 Hepatic encephalopathy: Secondary | ICD-10-CM

## 2021-02-16 DIAGNOSIS — K219 Gastro-esophageal reflux disease without esophagitis: Secondary | ICD-10-CM

## 2021-02-16 DIAGNOSIS — F17210 Nicotine dependence, cigarettes, uncomplicated: Secondary | ICD-10-CM | POA: Diagnosis present

## 2021-02-16 DIAGNOSIS — D696 Thrombocytopenia, unspecified: Secondary | ICD-10-CM | POA: Diagnosis present

## 2021-02-16 DIAGNOSIS — M25562 Pain in left knee: Secondary | ICD-10-CM | POA: Diagnosis present

## 2021-02-16 DIAGNOSIS — Z79899 Other long term (current) drug therapy: Secondary | ICD-10-CM

## 2021-02-16 DIAGNOSIS — Y903 Blood alcohol level of 60-79 mg/100 ml: Secondary | ICD-10-CM | POA: Diagnosis present

## 2021-02-16 DIAGNOSIS — Z888 Allergy status to other drugs, medicaments and biological substances status: Secondary | ICD-10-CM

## 2021-02-16 DIAGNOSIS — Z7141 Alcohol abuse counseling and surveillance of alcoholic: Secondary | ICD-10-CM

## 2021-02-16 DIAGNOSIS — Z20822 Contact with and (suspected) exposure to covid-19: Secondary | ICD-10-CM | POA: Diagnosis present

## 2021-02-16 DIAGNOSIS — Z6841 Body Mass Index (BMI) 40.0 and over, adult: Secondary | ICD-10-CM

## 2021-02-16 DIAGNOSIS — Z9151 Personal history of suicidal behavior: Secondary | ICD-10-CM

## 2021-02-16 DIAGNOSIS — R601 Generalized edema: Secondary | ICD-10-CM

## 2021-02-16 DIAGNOSIS — F32A Depression, unspecified: Secondary | ICD-10-CM | POA: Diagnosis present

## 2021-02-16 DIAGNOSIS — R404 Transient alteration of awareness: Secondary | ICD-10-CM | POA: Diagnosis not present

## 2021-02-16 DIAGNOSIS — I10 Essential (primary) hypertension: Secondary | ICD-10-CM | POA: Diagnosis present

## 2021-02-16 DIAGNOSIS — K7040 Alcoholic hepatic failure without coma: Principal | ICD-10-CM | POA: Diagnosis present

## 2021-02-16 DIAGNOSIS — Z5900 Homelessness unspecified: Secondary | ICD-10-CM

## 2021-02-16 DIAGNOSIS — K7030 Alcoholic cirrhosis of liver without ascites: Secondary | ICD-10-CM | POA: Diagnosis present

## 2021-02-16 DIAGNOSIS — N50811 Right testicular pain: Secondary | ICD-10-CM | POA: Diagnosis present

## 2021-02-16 DIAGNOSIS — F10129 Alcohol abuse with intoxication, unspecified: Secondary | ICD-10-CM | POA: Diagnosis present

## 2021-02-16 DIAGNOSIS — R748 Abnormal levels of other serum enzymes: Secondary | ICD-10-CM

## 2021-02-16 DIAGNOSIS — D6959 Other secondary thrombocytopenia: Secondary | ICD-10-CM | POA: Diagnosis present

## 2021-02-16 DIAGNOSIS — D7589 Other specified diseases of blood and blood-forming organs: Secondary | ICD-10-CM | POA: Diagnosis present

## 2021-02-16 NOTE — ED Notes (Signed)
Pt is sleeping and snoring audibly.

## 2021-02-16 NOTE — ED Triage Notes (Signed)
Pt BIB [facility] from the [facility] [employer]. Requested to go to the hospital for testicle pain and L knee pain. Sleeping in triage.

## 2021-02-17 ENCOUNTER — Emergency Department (HOSPITAL_COMMUNITY): Payer: Medicaid Other

## 2021-02-17 DIAGNOSIS — K7290 Hepatic failure, unspecified without coma: Secondary | ICD-10-CM

## 2021-02-17 DIAGNOSIS — L03119 Cellulitis of unspecified part of limb: Secondary | ICD-10-CM | POA: Diagnosis not present

## 2021-02-17 DIAGNOSIS — I10 Essential (primary) hypertension: Secondary | ICD-10-CM | POA: Diagnosis present

## 2021-02-17 DIAGNOSIS — Z560 Unemployment, unspecified: Secondary | ICD-10-CM | POA: Diagnosis not present

## 2021-02-17 DIAGNOSIS — Z20822 Contact with and (suspected) exposure to covid-19: Secondary | ICD-10-CM | POA: Diagnosis present

## 2021-02-17 DIAGNOSIS — L0390 Cellulitis, unspecified: Secondary | ICD-10-CM

## 2021-02-17 DIAGNOSIS — N50811 Right testicular pain: Secondary | ICD-10-CM | POA: Diagnosis not present

## 2021-02-17 DIAGNOSIS — Z5900 Homelessness unspecified: Secondary | ICD-10-CM | POA: Diagnosis not present

## 2021-02-17 DIAGNOSIS — F32A Depression, unspecified: Secondary | ICD-10-CM | POA: Diagnosis present

## 2021-02-17 DIAGNOSIS — D696 Thrombocytopenia, unspecified: Secondary | ICD-10-CM

## 2021-02-17 DIAGNOSIS — Y903 Blood alcohol level of 60-79 mg/100 ml: Secondary | ICD-10-CM | POA: Diagnosis present

## 2021-02-17 DIAGNOSIS — Z888 Allergy status to other drugs, medicaments and biological substances status: Secondary | ICD-10-CM | POA: Diagnosis not present

## 2021-02-17 DIAGNOSIS — R609 Edema, unspecified: Secondary | ICD-10-CM | POA: Diagnosis not present

## 2021-02-17 DIAGNOSIS — K7040 Alcoholic hepatic failure without coma: Secondary | ICD-10-CM | POA: Diagnosis not present

## 2021-02-17 DIAGNOSIS — Z9151 Personal history of suicidal behavior: Secondary | ICD-10-CM | POA: Diagnosis not present

## 2021-02-17 DIAGNOSIS — M25562 Pain in left knee: Secondary | ICD-10-CM | POA: Diagnosis not present

## 2021-02-17 DIAGNOSIS — F10129 Alcohol abuse with intoxication, unspecified: Secondary | ICD-10-CM | POA: Diagnosis present

## 2021-02-17 DIAGNOSIS — K7030 Alcoholic cirrhosis of liver without ascites: Secondary | ICD-10-CM | POA: Diagnosis not present

## 2021-02-17 DIAGNOSIS — D6959 Other secondary thrombocytopenia: Secondary | ICD-10-CM | POA: Diagnosis present

## 2021-02-17 DIAGNOSIS — Z7141 Alcohol abuse counseling and surveillance of alcoholic: Secondary | ICD-10-CM | POA: Diagnosis not present

## 2021-02-17 DIAGNOSIS — Z79899 Other long term (current) drug therapy: Secondary | ICD-10-CM | POA: Diagnosis not present

## 2021-02-17 DIAGNOSIS — F17210 Nicotine dependence, cigarettes, uncomplicated: Secondary | ICD-10-CM | POA: Diagnosis present

## 2021-02-17 DIAGNOSIS — L03113 Cellulitis of right upper limb: Secondary | ICD-10-CM | POA: Diagnosis present

## 2021-02-17 DIAGNOSIS — D7589 Other specified diseases of blood and blood-forming organs: Secondary | ICD-10-CM | POA: Diagnosis present

## 2021-02-17 DIAGNOSIS — R748 Abnormal levels of other serum enzymes: Secondary | ICD-10-CM | POA: Diagnosis not present

## 2021-02-17 DIAGNOSIS — L03114 Cellulitis of left upper limb: Secondary | ICD-10-CM | POA: Diagnosis present

## 2021-02-17 DIAGNOSIS — Z6841 Body Mass Index (BMI) 40.0 and over, adult: Secondary | ICD-10-CM | POA: Diagnosis not present

## 2021-02-17 LAB — PROTIME-INR
INR: 1.7 — ABNORMAL HIGH (ref 0.8–1.2)
Prothrombin Time: 19.6 seconds — ABNORMAL HIGH (ref 11.4–15.2)

## 2021-02-17 LAB — CBC WITH DIFFERENTIAL/PLATELET
Abs Immature Granulocytes: 0.04 10*3/uL (ref 0.00–0.07)
Basophils Absolute: 0.1 10*3/uL (ref 0.0–0.1)
Basophils Relative: 1 %
Eosinophils Absolute: 0.1 10*3/uL (ref 0.0–0.5)
Eosinophils Relative: 1 %
HCT: 36.8 % — ABNORMAL LOW (ref 39.0–52.0)
Hemoglobin: 12.7 g/dL — ABNORMAL LOW (ref 13.0–17.0)
Immature Granulocytes: 1 %
Lymphocytes Relative: 20 %
Lymphs Abs: 1.7 10*3/uL (ref 0.7–4.0)
MCH: 34.6 pg — ABNORMAL HIGH (ref 26.0–34.0)
MCHC: 34.5 g/dL (ref 30.0–36.0)
MCV: 100.3 fL — ABNORMAL HIGH (ref 80.0–100.0)
Monocytes Absolute: 1.4 10*3/uL — ABNORMAL HIGH (ref 0.1–1.0)
Monocytes Relative: 17 %
Neutro Abs: 5 10*3/uL (ref 1.7–7.7)
Neutrophils Relative %: 60 %
Platelets: 64 10*3/uL — ABNORMAL LOW (ref 150–400)
RBC: 3.67 MIL/uL — ABNORMAL LOW (ref 4.22–5.81)
RDW: 15.9 % — ABNORMAL HIGH (ref 11.5–15.5)
WBC: 8.3 10*3/uL (ref 4.0–10.5)
nRBC: 0 % (ref 0.0–0.2)

## 2021-02-17 LAB — COMPREHENSIVE METABOLIC PANEL
ALT: 74 U/L — ABNORMAL HIGH (ref 0–44)
AST: 124 U/L — ABNORMAL HIGH (ref 15–41)
Albumin: 2.4 g/dL — ABNORMAL LOW (ref 3.5–5.0)
Alkaline Phosphatase: 204 U/L — ABNORMAL HIGH (ref 38–126)
Anion gap: 5 (ref 5–15)
BUN: 12 mg/dL (ref 6–20)
CO2: 26 mmol/L (ref 22–32)
Calcium: 8.6 mg/dL — ABNORMAL LOW (ref 8.9–10.3)
Chloride: 107 mmol/L (ref 98–111)
Creatinine, Ser: 0.95 mg/dL (ref 0.61–1.24)
GFR, Estimated: >60 mL/min (ref >60)
Glucose, Bld: 135 mg/dL — ABNORMAL HIGH (ref 70–99)
Potassium: 4.7 mmol/L (ref 3.5–5.1)
Sodium: 138 mmol/L (ref 135–145)
Total Bilirubin: 5.6 mg/dL — ABNORMAL HIGH (ref 0.3–1.2)
Total Protein: 6.6 g/dL (ref 6.5–8.1)

## 2021-02-17 LAB — BLOOD GAS, VENOUS
Acid-Base Excess: 0.6 mmol/L (ref 0.0–2.0)
Bicarbonate: 26.7 mmol/L (ref 20.0–28.0)
O2 Saturation: 62.7 %
Patient temperature: 98.6
pCO2, Ven: 50.8 mmHg (ref 44.0–60.0)
pH, Ven: 7.34 (ref 7.250–7.430)
pO2, Ven: 40.4 mmHg (ref 32.0–45.0)

## 2021-02-17 LAB — RESP PANEL BY RT-PCR (FLU A&B, COVID) ARPGX2
Influenza A by PCR: NEGATIVE
Influenza B by PCR: NEGATIVE
SARS Coronavirus 2 by RT PCR: NEGATIVE

## 2021-02-17 LAB — URINALYSIS, ROUTINE W REFLEX MICROSCOPIC
Glucose, UA: NEGATIVE mg/dL
Hgb urine dipstick: NEGATIVE
Ketones, ur: NEGATIVE mg/dL
Leukocytes,Ua: NEGATIVE
Nitrite: NEGATIVE
Protein, ur: NEGATIVE mg/dL
Specific Gravity, Urine: 1.028 (ref 1.005–1.030)
pH: 5 (ref 5.0–8.0)

## 2021-02-17 LAB — HIV ANTIBODY (ROUTINE TESTING W REFLEX): HIV Screen 4th Generation wRfx: NONREACTIVE

## 2021-02-17 LAB — AMMONIA: Ammonia: 110 umol/L — ABNORMAL HIGH (ref 9–35)

## 2021-02-17 LAB — ETHANOL: Alcohol, Ethyl (B): 68 mg/dL — ABNORMAL HIGH (ref <10)

## 2021-02-17 MED ORDER — LORAZEPAM 1 MG PO TABS: 0.0000 mg | ORAL_TABLET | Freq: Two times a day (BID) | ORAL | Status: AC

## 2021-02-17 MED ORDER — THIAMINE HCL 100 MG PO TABS
100.0000 mg | ORAL_TABLET | Freq: Every day | ORAL | Status: DC
  Administered 2021-02-17 – 2021-02-27 (×11): 100 mg via ORAL
  Filled 2021-02-17 (×12): qty 1

## 2021-02-17 MED ORDER — SODIUM CHLORIDE 0.9% FLUSH
3.0000 mL | Freq: Two times a day (BID) | INTRAVENOUS | Status: DC
  Administered 2021-02-17 – 2021-02-25 (×16): 3 mL via INTRAVENOUS

## 2021-02-17 MED ORDER — LORAZEPAM 2 MG/ML IJ SOLN: 1.0000 mg | INTRAMUSCULAR | Status: AC | PRN

## 2021-02-17 MED ORDER — ADULT MULTIVITAMIN W/MINERALS CH
1.0000 | ORAL_TABLET | Freq: Every day | ORAL | Status: DC
  Administered 2021-02-17 – 2021-02-27 (×11): 1 via ORAL
  Filled 2021-02-17 (×13): qty 1

## 2021-02-17 MED ORDER — LORAZEPAM 1 MG PO TABS
0.0000 mg | ORAL_TABLET | Freq: Four times a day (QID) | ORAL | Status: AC
  Administered 2021-02-17: 1 mg via ORAL
  Filled 2021-02-17 (×2): qty 1

## 2021-02-17 MED ORDER — LACTULOSE 10 GM/15ML PO SOLN
30.0000 g | Freq: Three times a day (TID) | ORAL | Status: DC
  Administered 2021-02-17 – 2021-02-27 (×18): 30 g via ORAL
  Filled 2021-02-17 (×22): qty 45

## 2021-02-17 MED ORDER — SODIUM CHLORIDE 0.9 % IV SOLN
2.0000 g | Freq: Every day | INTRAVENOUS | Status: AC
  Administered 2021-02-17 – 2021-02-23 (×7): 2 g via INTRAVENOUS
  Filled 2021-02-17: qty 2
  Filled 2021-02-17 (×3): qty 20
  Filled 2021-02-17 (×2): qty 2
  Filled 2021-02-17: qty 20

## 2021-02-17 MED ORDER — FOLIC ACID 1 MG PO TABS
1.0000 mg | ORAL_TABLET | Freq: Every day | ORAL | Status: DC
  Administered 2021-02-17 – 2021-02-27 (×11): 1 mg via ORAL
  Filled 2021-02-17 (×11): qty 1

## 2021-02-17 MED ORDER — LACTULOSE 10 GM/15ML PO SOLN
30.0000 g | Freq: Once | ORAL | Status: AC
  Administered 2021-02-17: 30 g via ORAL
  Filled 2021-02-17: qty 60

## 2021-02-17 MED ORDER — RIFAXIMIN 550 MG PO TABS
550.0000 mg | ORAL_TABLET | Freq: Two times a day (BID) | ORAL | Status: DC
  Administered 2021-02-17 – 2021-02-27 (×21): 550 mg via ORAL
  Filled 2021-02-17 (×21): qty 1

## 2021-02-17 MED ORDER — LORAZEPAM 1 MG PO TABS: 1.0000 mg | ORAL_TABLET | ORAL | Status: AC | PRN

## 2021-02-17 MED ORDER — THIAMINE HCL 100 MG/ML IJ SOLN
100.0000 mg | Freq: Every day | INTRAMUSCULAR | Status: DC
  Filled 2021-02-17 (×2): qty 2

## 2021-02-17 NOTE — ED Notes (Signed)
Attempted IV x3 without success due to patient's inability to hold still and follow commands.

## 2021-02-17 NOTE — Progress Notes (Signed)
Pt arrived to room 1517 from ED.

## 2021-02-17 NOTE — ED Provider Notes (Signed)
Emergency Medicine Provider Triage Evaluation Note  Angel Webster , a 60 y.o. male  was evaluated in triage.  Pt complains of knee pain and testicular pain, brought in via EMS.  On my evaluation patient is repeatedly falling asleep and has to be woken up several times and then states "I am not sleeping".  It takes numerous times of repeating questions to get patient to answer.  Reports right testicular pain for 3 days.  Also complains of knee pain for the past 1 to 2 days.  Denies trauma, initially states this was his right knee, and then reports it is his left.  It is difficult to obtain any additional information from patient as he continues to repeatedly fall asleep, and is not willing to wake up and participate in further evaluation.  Review of Systems  Positive: Testicular pain, knee pain Negative: fever  Physical Exam  BP (!) 158/98 (BP Location: Right Arm)   Pulse (!) 116   Temp 98.9 F (37.2 C) (Oral)   Resp 18   Ht 5\' 9"  (1.753 m)   Wt 124.7 kg   SpO2 98%   BMI 40.61 kg/m  Gen:   Awake, but quickly falls back asleep Resp:  Normal effort  MSK:   Moves extremities without difficulty some tenderness over the left knee, difficult to fully assess in triage room   Medical Decision Making  Medically screening exam initiated at 12:17 AM.  Appropriate orders placed.  was informed that the remainder of the evaluation will be completed by another provider, this initial triage assessment does not replace that evaluation, and the importance of remaining in the ED until their evaluation is complete.    Lucrezia Europe 02/17/21 0029    02/19/21, MD 02/23/21 2010

## 2021-02-17 NOTE — Plan of Care (Signed)

## 2021-02-17 NOTE — ED Provider Notes (Signed)
[facility] DEPT Provider Note   CSN: [id number] Arrival date & time: 02/16/21  2231     History Chief Complaint  Patient presents with  . Testicle Pain  . Knee Pain    Angel Webster is a 60 y.o. male.  Angel Webster is a 60 y.o. male with a history of cirrhosis, hepatic encephalopathy, hypertension and asthma, who presents complaining of knee pain and testicular pain, brought in via EMS from [facility].  On my evaluation patient is repeatedly falling asleep and has to be woken up several times, and then states "I am not sleeping".  It takes numerous times of repeating questions to get patient to answer.  Reports right testicular pain for 3 days, no trauma or injury, no hx of similar pain, is worried it could be cancer, but had not had swelling. Denies urinary symptoms.  Also complains of knee pain for the past 1 to 2 days.  Denies trauma, initially states this was his right knee, and then reports it is his left.  It is difficult to obtain any additional information from patient as he continues to repeatedly fall asleep and then becomes agitated when he is repeatedly woken up.  Level 5 caveat: Altered mental status        Past Medical History:  Diagnosis Date  . Asthma   . Cirrhosis ([place])   . Hypertension     Patient Active Problem List   Diagnosis Date Noted  . Thrombocytopenia ([place])   . Alcoholic cirrhosis ([place])   . Hepatic encephalopathy ([place]) 11/01/2020    History reviewed. No pertinent surgical history.     Family History  Family history unknown: Yes    Social History   Tobacco Use  . Smoking status: Current Every Day Smoker  . Smokeless tobacco: Current User  Vaping Use  . Vaping Use: Never used  Substance Use Topics  . Alcohol use: Not Currently  . Drug use: Not Currently    Home Medications Prior to Admission medications   Medication Sig Start Date End Date Taking? Authorizing Provider  albuterol (VENTOLIN HFA)  108 (90 Base) MCG/ACT inhaler Inhale 1-2 puffs into the lungs every 4 (four) hours as needed for wheezing or shortness of breath. 07/04/20   [provider]  carvedilol (COREG) 3.125 MG tablet Take 1 tablet (3.125 mg total) by mouth 2 (two) times daily. 01/22/21   Kerin Perna, NP  CONTOUR NEXT TEST test strip SMARTSIG:Via Meter 07/17/20   [provider]  folic acid (FOLVITE) 1 MG tablet Take 1 mg by mouth daily. 07/01/20   [provider]  furosemide (LASIX) 40 MG tablet Take 1 tablet (40 mg total) by mouth 2 (two) times daily. 01/22/21   Kerin Perna, NP  lactulose (CHRONULAC) 10 GM/15ML solution take 30 milliliters by mouth three times a day Patient taking differently: Take 20 g by mouth 3 (three) times daily. 10/24/20   Kerin Perna, NP  pantoprazole (PROTONIX) 40 MG tablet Take 1 tablet (40 mg total) by mouth 2 (two) times daily. 11/14/20   Kerin Perna, NP  spironolactone (ALDACTONE) 100 MG tablet Take 1 tablet (100 mg total) by mouth daily. 01/22/21   Kerin Perna, NP  XIFAXAN 550 MG TABS tablet Take 1 tablet by mouth twice daily 01/17/21   Kerin Perna, NP  zinc sulfate 220 (50 Zn) MG capsule Take 1 capsule (220 mg total) by mouth daily. 01/22/21   Kerin Perna, NP  Allergies    Ace inhibitors and Other  Review of Systems   Review of Systems  Unable to perform ROS: Mental status change    Physical Exam Updated Vital Signs BP (!) 158/98 (BP Location: Right Arm)   Pulse (!) 116   Temp 98.9 F (37.2 C) (Oral)   Resp 18   Ht 5\' 9"  (1.753 m)   Wt 124.7 kg   SpO2 98%   BMI 40.61 kg/m   Physical Exam Vitals and nursing note reviewed.  Constitutional:      General: He is sleeping. He is not in acute distress.    Appearance: He is well-developed. He is obese. He is ill-appearing. He is not diaphoretic.     Comments: Somnolent, awakens to voice but quickly falls back asleep, follows commands but has to have  questions and directions repeated several times, ill-appearing  HENT:     Head: Normocephalic and atraumatic.  Eyes:     General:        Right eye: No discharge.        Left eye: No discharge.     Pupils: Pupils are equal, round, and reactive to light.  Cardiovascular:     Rate and Rhythm: Regular rhythm. Tachycardia present.     Heart sounds: Normal heart sounds. No murmur heard. No friction rub. No gallop.   Pulmonary:     Effort: Pulmonary effort is normal. No respiratory distress.     Breath sounds: Normal breath sounds. No wheezing or rales.  Abdominal:     General: Bowel sounds are normal. There is distension.     Palpations: Abdomen is soft. There is no mass.     Tenderness: There is no abdominal tenderness. There is no guarding.     Comments: Mildly distended but soft, bowel sounds present, nontender to palpation throughout  Genitourinary:    Comments: Chaperone present during genital exam. There is some tenderness over the right testicle without swelling masses or overlying skin changes to the scrotum, left testicle nontender. Musculoskeletal:        General: No deformity.     Cervical back: Neck supple.     Comments: Mild tenderness noted to the left knee, no effusion, erythema or warmth, able to flex and extend the knee.  No palpable deformity, distal pulses intact  Skin:    General: Skin is warm and dry.     Capillary Refill: Capillary refill takes less than 2 seconds.     Comments: Vitiligo noted  Neurological:     Coordination: Coordination normal.     Comments: Speech is clear, able to follow commands Moves extremities without difficulty, some asterixis noted   Psychiatric:     Comments: Unable to assess due to altered mental status     ED Results / Procedures / Treatments   Labs (all labs ordered are listed, but only abnormal results are displayed) Labs Reviewed  CBC WITH DIFFERENTIAL/PLATELET - Abnormal; Notable for the following components:      Result  Value   RBC 3.67 (*)    Hemoglobin 12.7 (*)    HCT 36.8 (*)    MCV 100.3 (*)    MCH 34.6 (*)    RDW 15.9 (*)    Platelets 64 (*)    Monocytes Absolute 1.4 (*)    All other components within normal limits  URINALYSIS, ROUTINE W REFLEX MICROSCOPIC - Abnormal; Notable for the following components:   Color, Urine AMBER (*)    APPearance HAZY (*)  Bilirubin Urine SMALL (*)    All other components within normal limits  AMMONIA - Abnormal; Notable for the following components:   Ammonia 110 (*)    All other components within normal limits  COMPREHENSIVE METABOLIC PANEL - Abnormal; Notable for the following components:   Glucose, Bld 135 (*)    Calcium 8.6 (*)    Albumin 2.4 (*)    AST 124 (*)    ALT 74 (*)    Alkaline Phosphatase 204 (*)    Total Bilirubin 5.6 (*)    All other components within normal limits  PROTIME-INR - Abnormal; Notable for the following components:   Prothrombin Time 19.6 (*)    INR 1.7 (*)    All other components within normal limits  ETHANOL - Abnormal; Notable for the following components:   Alcohol, Ethyl (B) 68 (*)    All other components within normal limits  RESP PANEL BY RT-PCR (FLU A&B, COVID) ARPGX2  BLOOD GAS, VENOUS  GC/CHLAMYDIA PROBE AMP ([place]) NOT AT [facility]    EKG None  Radiology DG Knee Complete 4 Views Left  Result Date: 02/17/2021 CLINICAL DATA:  Knee pain. EXAM: LEFT KNEE - COMPLETE 4+ VIEW COMPARISON:  None. FINDINGS: No evidence of fracture, dislocation, or joint effusion. Enthesopathy of the patella superiorly and inferiorly. Nonaggressive appearing periosteal reaction along the proximal tibia. Mild tibiofemoral joint space narrowing. No aggressive appearing focal bone abnormality. Soft tissues are unremarkable. IMPRESSION: No acute displaced fracture or dislocation. Electronically Signed   By: Iven Finn M.D.   On: 02/17/2021 00:49   US SCROTUM W/DOPPLER  Result Date: 02/17/2021 CLINICAL DATA:  60 year old with right  testicular pain. EXAM: SCROTAL ULTRASOUND DOPPLER ULTRASOUND OF THE TESTICLES TECHNIQUE: Complete ultrasound examination of the testicles, epididymis, and other scrotal structures was performed. Color and spectral Doppler ultrasound were also utilized to evaluate blood flow to the testicles. COMPARISON:  None. FINDINGS: Right testicle Measurements: 4.2 x 2.5 x 2.7 cm. Homogeneous echogenicity. Normal blood flow. No mass or microlithiasis visualized. Left testicle Measurements: 4.0 x 2.4 x 2.6 cm. Homogeneous echogenicity. Normal blood flow. No mass or microlithiasis visualized. Right epididymis:  Normal in size and appearance. Left epididymis:  Normal in size and appearance. Hydrocele:  None visualized. Varicocele:  None visualized. Pulsed Doppler interrogation of both testes demonstrates normal low resistance arterial and venous waveforms bilaterally. Other: Technically challenging exam due to patient movement and medical condition. IMPRESSION: Unremarkable scrotal ultrasound. Electronically Signed   By: Keith Rake M.D.   On: 02/17/2021 03:30    Procedures .Critical Care Performed by: Jacqlyn Larsen, PA-C Authorized by: Jacqlyn Larsen, PA-C   Critical care provider statement:    Critical care time (minutes):  45   Critical care was necessary to treat or prevent imminent or life-threatening deterioration of the following conditions:  Hepatic failure (Hepatic encephalopathy)   Critical care was time spent personally by me on the following activities:  Discussions with consultants, evaluation of patient's response to treatment, examination of patient, ordering and performing treatments and interventions, ordering and review of laboratory studies, ordering and review of radiographic studies, pulse oximetry, re-evaluation of patient's condition, obtaining history from patient or surrogate and review of old charts     Medications Ordered in ED Medications  LORazepam (ATIVAN) tablet 1-4 mg (has no  administration in time range)    Or  LORazepam (ATIVAN) injection 1-4 mg (has no administration in time range)  thiamine tablet 100 mg (has no administration in time  range)    Or  thiamine (B-1) injection 100 mg (has no administration in time range)  folic acid (FOLVITE) tablet 1 mg (has no administration in time range)  multivitamin with minerals tablet 1 tablet (has no administration in time range)  LORazepam (ATIVAN) tablet 0-4 mg (0 mg Oral Not Given 02/17/21 0451)    Followed by  LORazepam (ATIVAN) tablet 0-4 mg (has no administration in time range)  lactulose (CHRONULAC) 10 GM/15ML solution 30 g (has no administration in time range)  lactulose (CHRONULAC) 10 GM/15ML solution 30 g (30 g Oral Given 02/17/21 0323)    ED Course  I have reviewed the triage vital signs and the nursing notes.  Pertinent labs & imaging results that were available during my care of the patient were reviewed by me and considered in my medical decision making (see chart for details).    MDM Rules/Calculators/A&P                         60 year old male arrives via EMS complaining of knee pain and testicular pain, but noted to be very somnolent on arrival, awakens to loud voice but quickly falls back asleep, difficult to obtain much history.  On chart review patient has known history of alcoholic cirrhosis and has previously been admitted for hepatic encephalopathy.  High clinical suspicion that this may being occurring again, altered mental status could also be in the setting of hypercapnia, electrolyte derangement, infection.  No signs of trauma over the head.  Regarding testicular pain will get ultrasound, urinalysis and GC chlamydia and will get left knee x-ray as well given reported pain.  Additional history obtained from chart. Previous records obtained and reviewed via EMR  Lab Tests:  I Ordered, reviewed, and interpreted labs, which included:  CBC: No leukocytosis, stable hemoglobin, platelets of 64,  consistent with prior labs CMP: Glucose 135, no other significant electrolyte derangements and normal renal function.  T bili of 5.6, AST, ALT and alk phos elevated, but all LFTs at baseline when compared to recent lab work. Ammonia: Elevated at 110, likely the cause of patient's somnolence and altered mental status VBG: No hypercapnia to contribute to AMS INR: 1.7 in the setting of cirrhosis Ethanol: 68  Imaging Studies ordered:  I ordered imaging studies which included left knee x-ray, testicular ultrasound, I independently visualized and interpreted imaging which showed : Left knee x-ray: No acute bony abnormality or dislocation Testicular ultrasound: no masses, torsion or other acute findings no signs of epididymitis or orchitis.  ED Course:   Critical interventions: Lactulose ordered for hyperammonemia  Patient will require admission for hepatic encephalopathy given ongoing somnolence  I consulted Dr. Cyd Silence and discussed lab and imaging findings, he will see and admit the patient.   Portions of this note were generated with [employer]. Dictation errors may occur despite best attempts at proofreading.   Final Clinical Impression(s) / ED Diagnoses Final diagnoses:  Hepatic encephalopathy ([place])  Right testicular pain  Acute pain of left knee    Rx / DC Orders ED Discharge Orders    None       Janet Berlin 02/17/21 [id number]    Luna Fuse, MD 02/23/21 2010

## 2021-02-17 NOTE — Consult Note (Signed)
[facility] Consultation Note  Referring Provider: [facility] Primary Care Physician:  Grayce Sessions, NP  Reason for Consultation:  cirrhosis  HPI: Angel Webster is a 60 y.o. male admitted for testicular pain.  Found to have elevated liver enzymes and confusion.  Suspected recent bilateral upper extremity cellulitis.  No abdominal pain, blood in stool.  Cirrhosis diagnosed few years ago, unclear whether he's seen gastroenterologist.  Alcohol felt to be etiology, and use is ongoing.     Past Medical History:  Diagnosis Date  . Asthma   . Cirrhosis ([facility])   . Hypertension     History reviewed. No pertinent surgical history.  Prior to Admission medications   Medication Sig Start Date End Date Taking? Authorizing Provider  albuterol (VENTOLIN HFA) 108 (90 Base) MCG/ACT inhaler Inhale 1-2 puffs into the lungs every 4 (four) hours as needed for wheezing or shortness of breath. 07/04/20  Yes [provider]  carvedilol (COREG) 3.125 MG tablet Take 1 tablet (3.125 mg total) by mouth 2 (two) times daily. 01/22/21  Yes Grayce Sessions, NP  folic acid (FOLVITE) 1 MG tablet Take 1 mg by mouth daily. 07/01/20  Yes [provider]  furosemide (LASIX) 40 MG tablet Take 1 tablet (40 mg total) by mouth 2 (two) times daily. 01/22/21  Yes Grayce Sessions, NP  lactulose (CHRONULAC) 10 GM/15ML solution take 30 milliliters by mouth three times a day Patient taking differently: Take 20 g by mouth 3 (three) times daily. 10/24/20  Yes Grayce Sessions, NP  pantoprazole (PROTONIX) 40 MG tablet Take 1 tablet (40 mg total) by mouth 2 (two) times daily. 11/14/20  Yes Grayce Sessions, NP  spironolactone (ALDACTONE) 100 MG tablet Take 1 tablet (100 mg total) by mouth daily. 01/22/21  Yes Grayce Sessions, NP  XIFAXAN 550 MG TABS tablet Take 1 tablet by mouth twice daily Patient taking differently: Take 550 mg by mouth 2 (two) times daily. 01/17/21  Yes Grayce Sessions, NP  zinc sulfate 220 (50 Zn) MG capsule Take 1 capsule (220 mg total) by mouth daily. 01/22/21  Yes Grayce Sessions, NP  CONTOUR NEXT TEST test strip SMARTSIG:Via Meter 07/17/20   [provider]    Current Facility-Administered Medications  Medication Dose Route Frequency Provider Last Rate Last Admin  . cefTRIAXone (ROCEPHIN) 2 g in sodium chloride 0.9 % 100 mL IVPB  2 g Intravenous Daily Jae Dire, MD      . folic acid (FOLVITE) tablet 1 mg  1 mg Oral Daily Jae Dire, MD   1 mg at 02/17/21 8916  . lactulose (CHRONULAC) 10 GM/15ML solution 30 g  30 g Oral TID Jae Dire, MD   30 g at 02/17/21 0933  . LORazepam (ATIVAN) tablet 1-4 mg  1-4 mg Oral Q1H PRN Jae Dire, MD       Or  . LORazepam (ATIVAN) injection 1-4 mg  1-4 mg Intravenous Q1H PRN Jae Dire, MD      . LORazepam (ATIVAN) tablet 0-4 mg  0-4 mg Oral Q6H Jae Dire, MD       Followed by  . [START ON 02/19/2021] LORazepam (ATIVAN) tablet 0-4 mg  0-4 mg Oral Q12H Jae Dire, MD      . multivitamin with minerals tablet 1 tablet  1 tablet Oral Daily Jae Dire, MD   1 tablet at 02/17/21 [phone]  . sodium chloride flush (NS) 0.9 % injection 3  mL  3 mL Intravenous Q12H Jae Dire, MD      . thiamine tablet 100 mg  100 mg Oral Daily Jae Dire, MD   100 mg at 02/17/21 6546   Or  . thiamine (B-1) injection 100 mg  100 mg Intravenous Daily Jae Dire, MD        Allergies as of 02/16/2021 - Review Complete 02/05/2021  Allergen Reaction Noted  . Ace inhibitors Swelling 09/08/2020  . Other Hives 11/02/2020    Family History  Family history unknown: Yes    Social History   Socioeconomic History  . Marital status: Single    Spouse name: Not on file  . Number of children: Not on file  . Years of education: Not on file  . Highest education level: Not on file  Occupational History  . Not on file  Tobacco Use  . Smoking status: Current Every Day Smoker  .  Smokeless tobacco: Current User  Vaping Use  . Vaping Use: Never used  Substance and Sexual Activity  . Alcohol use: Not Currently  . Drug use: Not Currently  . Sexual activity: Not Currently  Other Topics Concern  . Not on file  Social History Narrative  . Not on file   Social Determinants of Health   Financial Resource Strain: Not on file  Food Insecurity: Not on file  Transportation Needs: Not on file  Physical Activity: Not on file  Stress: Not on file  Social Connections: Not on file  Intimate Partner Violence: Not on file    Review of Systems: As per HPI, all others negative  Physical Exam: Vital signs in last 24 hours: Temp:  [97.8 F (36.6 C)-98.9 F (37.2 C)] 98.1 F (36.7 C) (05/22 0919) Pulse Rate:  [86-116] 86 (05/22 0919) Resp:  [13-25] 19 (05/22 0919) BP: (128-158)/(71-98) 132/87 (05/22 0919) SpO2:  [90 %-99 %] 99 % (05/22 0919) Weight:  [124.7 kg] 124.7 kg (05/21 2244)   General:   Alert,  Overweight, deconditioned, pleasant and cooperative in NAD Head:  Normocephalic and atraumatic. Eyes:  Scleral icterus bilaterally   Conjunctiva pink. Ears:  Normal auditory acuity. Nose:  No deformity, discharge,  or lesions. Mouth:  No deformity or lesions.  Oropharynx pink & moist. Neck:  Supple; no masses or thyromegaly. Abdomen:  Soft, nontender and nondistended. No masses, hepatosplenomegaly or hernias noted. Normal bowel sounds, without guarding, and without rebound.     Msk:  Symmetrical without gross deformities. Normal posture. Pulses:  Normal pulses noted. Extremities:  Without clubbing; 2+ edema bilateral lower extremities Neurologic:  Mildly somnolent but arousable, can answer questions appropriately Skin:  Vitiligo; dorsal surface both hands erythema c/w cellulitis; otherwise intact without significant lesions or rashes. Cervical Nodes:  No significant cervical adenopathy. Psych:  Alert and cooperative. Normal mood and affect.   Lab Results: Recent  Labs    02/17/21 0147  WBC 8.3  HGB 12.7*  HCT 36.8*  PLT 64*   BMET Recent Labs    02/17/21 0147  NA 138  K 4.7  CL 107  CO2 26  GLUCOSE 135*  BUN 12  CREATININE 0.95  CALCIUM 8.6*   LFT Recent Labs    02/17/21 0147  PROT 6.6  ALBUMIN 2.4*  AST 124*  ALT 74*  ALKPHOS 204*  BILITOT 5.6*   PT/INR Recent Labs    02/17/21 0147  LABPROT 19.6*  INR 1.7*    Studies/Results: DG Knee Complete 4 Views Left  Result  Date: 02/17/2021 CLINICAL DATA:  Knee pain. EXAM: LEFT KNEE - COMPLETE 4+ VIEW COMPARISON:  None. FINDINGS: No evidence of fracture, dislocation, or joint effusion. Enthesopathy of the patella superiorly and inferiorly. Nonaggressive appearing periosteal reaction along the proximal tibia. Mild tibiofemoral joint space narrowing. No aggressive appearing focal bone abnormality. Soft tissues are unremarkable. IMPRESSION: No acute displaced fracture or dislocation. Electronically Signed   By: Tish Frederickson M.D.   On: 02/17/2021 00:49   US SCROTUM W/DOPPLER  Result Date: 02/17/2021 CLINICAL DATA:  60 year old with right testicular pain. EXAM: SCROTAL ULTRASOUND DOPPLER ULTRASOUND OF THE TESTICLES TECHNIQUE: Complete ultrasound examination of the testicles, epididymis, and other scrotal structures was performed. Color and spectral Doppler ultrasound were also utilized to evaluate blood flow to the testicles. COMPARISON:  None. FINDINGS: Right testicle Measurements: 4.2 x 2.5 x 2.7 cm. Homogeneous echogenicity. Normal blood flow. No mass or microlithiasis visualized. Left testicle Measurements: 4.0 x 2.4 x 2.6 cm. Homogeneous echogenicity. Normal blood flow. No mass or microlithiasis visualized. Right epididymis:  Normal in size and appearance. Left epididymis:  Normal in size and appearance. Hydrocele:  None visualized. Varicocele:  None visualized. Pulsed Doppler interrogation of both testes demonstrates normal low resistance arterial and venous waveforms bilaterally.  Other: Technically challenging exam due to patient movement and medical condition. IMPRESSION: Unremarkable scrotal ultrasound. Electronically Signed   By: Narda Rutherford M.D.   On: 02/17/2021 03:30    Impression:  1.  Confusion.  Suspected hepatic encephalopathy in setting of infection (cellulitis) and cirrhosis.  Improving. 2.  Ongoing alcohol use. 3.  Cirrhosis, felt alcohol-mediated, with elevated liver enzymes. 4.  Cellulitis bilateral upper extremity.  Plan:  1.  Continue lactulose.  Add Xifaxan. 2.  Ultrasound liver. 3.  Alcohol abstinence counseling. 4.  [facility] will follow.   LOS: 0 days   Thomasene Dubow,[place] M  02/17/2021, 12:37 PM  Cell [phone] If no answer or after 5 PM call [phone]

## 2021-02-17 NOTE — H&P (Addendum)
History and Physical        Hospital Admission Note Date: 02/17/2021  Patient name: Angel Webster Medical record number: [id number] Date of birth: Jan 30, 1961 Age: 60 y.o. Gender: male  PCP: Kerin Perna, NP  Patient coming from: homeless/[facility]   Chief Complaint    Chief Complaint  Patient presents with  . Testicle Pain  . Knee Pain      HPI:   Patient is a poor historian given his encephalopathy  This is a 60 year old male with past medical history of homelessness, hypertension, asthma, chronic thrombocytopenia, cirrhosis, alcohol abuse who was brought in by EMS from the [facility] initially presented to the ED with right testicular pain x3 days and left knee pain x1 to 2 days.  Initially, the ED provider had difficulty obtaining any information from him due to altered mental status and repeatedly falling asleep during exam.  He has since received 1 dose of lactulose and is able to have minimal conversation.  Says that his testicular pain and knee pain has resolved and that he is feeling a bit better.  Says that he had 3 beers yesterday. Says that he is homeless and has been stuck in the sun recently and has a blister on his left hand.  Denies any abdominal pain, nausea or vomiting.   ED Course: Afebrile, tachycardic, tachypneic,  hemodynamically stable, on room air. Notable Labs: Sodium 138, K4.7, glucose 135, BUN 12, creatinine 0.95, alk phos 204, AST 124, ALT 74, T bili 5.6, ammonia 110, WBC 8.3, Hb 12.7, platelets 64, INR 1.7, alcohol 68. Notable Imaging: Left knee XR unremarkable.  Scrotum US with Doppler unremarkable. Patient received lactulose 30 g x 1.    Vitals:   02/17/21 0830 02/17/21 0919  BP: (!) 141/72 132/87  Pulse: 86 86  Resp: (!) 25 19  Temp: 97.9 F (36.6 C) 98.1 F (36.7 C)  SpO2: 96% 99%     Review of Systems:  Review of  Systems  Reason unable to perform ROS: Limited due to mental status change and repeatedly falling asleep.  All other systems reviewed and are negative.   Medical/Social/Family History   Past Medical History: Past Medical History:  Diagnosis Date  . Asthma   . Cirrhosis ([place])   . Hypertension     History reviewed. No pertinent surgical history.  Medications: Prior to Admission medications   Medication Sig Start Date End Date Taking? Authorizing Provider  albuterol (VENTOLIN HFA) 108 (90 Base) MCG/ACT inhaler Inhale 1-2 puffs into the lungs every 4 (four) hours as needed for wheezing or shortness of breath. 07/04/20   [provider]  carvedilol (COREG) 3.125 MG tablet Take 1 tablet (3.125 mg total) by mouth 2 (two) times daily. 01/22/21   Kerin Perna, NP  CONTOUR NEXT TEST test strip SMARTSIG:Via Meter 07/17/20   [provider]  folic acid (FOLVITE) 1 MG tablet Take 1 mg by mouth daily. Patient not taking: Reported on 02/17/2021 07/01/20   [provider]  furosemide (LASIX) 40 MG tablet Take 1 tablet (40 mg total) by mouth 2 (two) times daily. 01/22/21   Kerin Perna, NP  lactulose (CHRONULAC) 10 GM/15ML solution take 30 milliliters by mouth  three times a day Patient taking differently: Take 20 g by mouth 3 (three) times daily. 10/24/20   Kerin Perna, NP  pantoprazole (PROTONIX) 40 MG tablet Take 1 tablet (40 mg total) by mouth 2 (two) times daily. 11/14/20   Kerin Perna, NP  spironolactone (ALDACTONE) 100 MG tablet Take 1 tablet (100 mg total) by mouth daily. 01/22/21   Kerin Perna, NP  XIFAXAN 550 MG TABS tablet Take 1 tablet by mouth twice daily 01/17/21   Kerin Perna, NP  zinc sulfate 220 (50 Zn) MG capsule Take 1 capsule (220 mg total) by mouth daily. 01/22/21   Kerin Perna, NP    Allergies:   Allergies  Allergen Reactions  . Ace Inhibitors Swelling    Tongue swelling  . Other Hives    Figs cause  hives    Social History:  reports that he has been smoking. He uses smokeless tobacco. He reports previous alcohol use. He reports previous drug use.  Family History: Family History  Family history unknown: Yes     Objective   Physical Exam: Blood pressure 132/87, pulse 86, temperature 98.1 F (36.7 C), temperature source Oral, resp. rate 19, height _0  (1.753 m), weight 124.7 kg, SpO2 99 %.  Physical Exam Vitals and nursing note reviewed.  Constitutional:      General: He is not in acute distress.    Comments: Awake but lethargic  HENT:     Head: Normocephalic.     Mouth/Throat:     Mouth: Mucous membranes are dry.  Eyes:     General: Scleral icterus present.     Pupils: Pupils are equal, round, and reactive to light.  Cardiovascular:     Rate and Rhythm: Normal rate and regular rhythm.  Pulmonary:     Effort: Pulmonary effort is normal.     Breath sounds: Normal breath sounds.  Abdominal:     General: Abdomen is flat. There is no distension.     Tenderness: There is no abdominal tenderness.  Musculoskeletal:        General: No deformity.     Comments: 2+ bilateral lower extremity edema  Skin:    Comments: Vitiligo Bilateral upper extremity forearm erythema, warmth, tenderness to palpation and large left dorsal hand blister  Neurological:     Comments: Slowed mentation Lethargic Oriented x3  Psychiatric:        Mood and Affect: Mood normal.         LABS on Admission: I have personally reviewed all the labs and imaging below    Basic Metabolic Panel: Recent Labs  Lab 02/17/21 0147  NA 138  K 4.7  CL 107  CO2 26  GLUCOSE 135*  BUN 12  CREATININE 0.95  CALCIUM 8.6*   Liver Function Tests: Recent Labs  Lab 02/17/21 0147  AST 124*  ALT 74*  ALKPHOS 204*  BILITOT 5.6*  PROT 6.6  ALBUMIN 2.4*   No results for input(s): LIPASE, AMYLASE in the last 168 hours. Recent Labs  Lab 02/17/21 0147  AMMONIA 110*   CBC: Recent Labs  Lab  02/17/21 0147  WBC 8.3  NEUTROABS 5.0  HGB 12.7*  HCT 36.8*  MCV 100.3*  PLT 64*   Cardiac Enzymes: No results for input(s): CKTOTAL, CKMB, CKMBINDEX, TROPONINI in the last 168 hours. BNP: Invalid input(s): POCBNP CBG: No results for input(s): GLUCAP in the last 168 hours.  Radiological Exams on Admission:  DG Knee Complete 4 Views Left  Result Date: 02/17/2021 CLINICAL DATA:  Knee pain. EXAM: LEFT KNEE - COMPLETE 4+ VIEW COMPARISON:  None. FINDINGS: No evidence of fracture, dislocation, or joint effusion. Enthesopathy of the patella superiorly and inferiorly. Nonaggressive appearing periosteal reaction along the proximal tibia. Mild tibiofemoral joint space narrowing. No aggressive appearing focal bone abnormality. Soft tissues are unremarkable. IMPRESSION: No acute displaced fracture or dislocation. Electronically Signed   By: Iven Finn M.D.   On: 02/17/2021 00:49   US SCROTUM W/DOPPLER  Result Date: 02/17/2021 CLINICAL DATA:  60 year old with right testicular pain. EXAM: SCROTAL ULTRASOUND DOPPLER ULTRASOUND OF THE TESTICLES TECHNIQUE: Complete ultrasound examination of the testicles, epididymis, and other scrotal structures was performed. Color and spectral Doppler ultrasound were also utilized to evaluate blood flow to the testicles. COMPARISON:  None. FINDINGS: Right testicle Measurements: 4.2 x 2.5 x 2.7 cm. Homogeneous echogenicity. Normal blood flow. No mass or microlithiasis visualized. Left testicle Measurements: 4.0 x 2.4 x 2.6 cm. Homogeneous echogenicity. Normal blood flow. No mass or microlithiasis visualized. Right epididymis:  Normal in size and appearance. Left epididymis:  Normal in size and appearance. Hydrocele:  None visualized. Varicocele:  None visualized. Pulsed Doppler interrogation of both testes demonstrates normal low resistance arterial and venous waveforms bilaterally. Other: Technically challenging exam due to patient movement and medical condition.  IMPRESSION: Unremarkable scrotal ultrasound. Electronically Signed   By: Keith Rake M.D.   On: 02/17/2021 03:30      EKG: Not done   A & P   Principal Problem:   Hepatic encephalopathy (HCC) Active Problems:   Thrombocytopenia ([place])   Alcoholic cirrhosis (HCC)   Cellulitis   1. Acute hepatic encephalopathy and suspected intoxicated a. Alcohol level: 68, Ammonia level 110, received lactulose x1 in the ED b. Continue lactulose c. GI consulted, discussed with Dr. Paulita Fujita  2. Decompensated cirrhosis, likely alcohol induced a. Meld Na-20 b. Abdomen nontender, nondistended, without signs of SBP but on ceftriaxone as below c. Appreciate GI recommendations  3. Bilateral upper extremity nonpurulent cellulitis, not septic a. Ceftriaxone  4. Alcohol abuse, intoxication a. Ethyl alcohol: 68, most recent drink was yesterday b. CIWA protocol  5. Chronic thrombocytopenia, likely from cirrhosis a. Platelets 64, down from 88 in January. Trend     DVT prophylaxis: SCDs   Code Status: Full Code  Diet: Low-sodium Family Communication: Admission, patients condition and plan of care including tests being ordered have been discussed with the patient who indicates understanding and agrees with the plan and Code Status.  Disposition Plan: The appropriate patient status for this patient is INPATIENT. Inpatient status is judged to be reasonable and necessary in order to provide the required intensity of service to ensure the patient's safety. The patient's presenting symptoms, physical exam findings, and initial radiographic and laboratory data in the context of their chronic comorbidities is felt to place them at high risk for further clinical deterioration. Furthermore, it is not anticipated that the patient will be medically stable for discharge from the hospital within 2 midnights of admission. The following factors support the patient status of inpatient.   " The patient's presenting  symptoms include encephalopathy. " The worrisome physical exam findings include encephalopathy. " The initial radiographic and laboratory data are worrisome because of elevated ammonia level, decompensated cirrhosis. " The chronic co-morbidities include alcohol abuse, homelessness, alcohol use.   * I certify that at the point of admission it is my clinical judgment that the patient will require inpatient hospital care spanning beyond 2 midnights from the  point of admission due to high intensity of service, high risk for further deterioration and high frequency of surveillance required.*   Status is: Inpatient  Remains inpatient appropriate because:Altered mental status, Ongoing diagnostic testing needed not appropriate for outpatient work up and Inpatient level of care appropriate due to severity of illness   Dispo: The patient is from: [employer] d/c is to: [facility]              Patient currently is not medically stable to d/c.   Difficult to place patient No         The medical decision making on this patient was of high complexity and the patient is at high risk for clinical deterioration, therefore this is a level 3  admission.  Consultants  . GI  Procedures  . None  Time Spent on Admission: 70 minutes    Harold Hedge, DO [facility]  02/17/2021, 9:30 AM

## 2021-02-17 NOTE — ED Notes (Signed)
Patient transported to X-ray 

## 2021-02-18 ENCOUNTER — Inpatient Hospital Stay (HOSPITAL_COMMUNITY): Payer: Medicaid Other

## 2021-02-18 DIAGNOSIS — K7290 Hepatic failure, unspecified without coma: Secondary | ICD-10-CM | POA: Diagnosis not present

## 2021-02-18 DIAGNOSIS — R748 Abnormal levels of other serum enzymes: Secondary | ICD-10-CM

## 2021-02-18 DIAGNOSIS — D696 Thrombocytopenia, unspecified: Secondary | ICD-10-CM | POA: Diagnosis not present

## 2021-02-18 DIAGNOSIS — K7030 Alcoholic cirrhosis of liver without ascites: Secondary | ICD-10-CM | POA: Diagnosis not present

## 2021-02-18 LAB — COMPREHENSIVE METABOLIC PANEL
ALT: 74 U/L — ABNORMAL HIGH (ref 0–44)
AST: 127 U/L — ABNORMAL HIGH (ref 15–41)
Albumin: 2.1 g/dL — ABNORMAL LOW (ref 3.5–5.0)
Alkaline Phosphatase: 239 U/L — ABNORMAL HIGH (ref 38–126)
Anion gap: 5 (ref 5–15)
BUN: 8 mg/dL (ref 6–20)
CO2: 25 mmol/L (ref 22–32)
Calcium: 8.4 mg/dL — ABNORMAL LOW (ref 8.9–10.3)
Chloride: 104 mmol/L (ref 98–111)
Creatinine, Ser: 0.79 mg/dL (ref 0.61–1.24)
GFR, Estimated: >60 mL/min (ref >60)
Glucose, Bld: 144 mg/dL — ABNORMAL HIGH (ref 70–99)
Potassium: 3.8 mmol/L (ref 3.5–5.1)
Sodium: 134 mmol/L — ABNORMAL LOW (ref 135–145)
Total Bilirubin: 4.4 mg/dL — ABNORMAL HIGH (ref 0.3–1.2)
Total Protein: 5.7 g/dL — ABNORMAL LOW (ref 6.5–8.1)

## 2021-02-18 LAB — CBC
HCT: 37.3 % — ABNORMAL LOW (ref 39.0–52.0)
Hemoglobin: 12.8 g/dL — ABNORMAL LOW (ref 13.0–17.0)
MCH: 35.2 pg — ABNORMAL HIGH (ref 26.0–34.0)
MCHC: 34.3 g/dL (ref 30.0–36.0)
MCV: 102.5 fL — ABNORMAL HIGH (ref 80.0–100.0)
Platelets: 61 10*3/uL — ABNORMAL LOW (ref 150–400)
RBC: 3.64 MIL/uL — ABNORMAL LOW (ref 4.22–5.81)
RDW: 16.2 % — ABNORMAL HIGH (ref 11.5–15.5)
WBC: 8.1 10*3/uL (ref 4.0–10.5)
nRBC: 0 % (ref 0.0–0.2)

## 2021-02-18 LAB — GC/CHLAMYDIA PROBE AMP (~~LOC~~) NOT AT ARMC
Chlamydia: NEGATIVE
Comment: NEGATIVE
Comment: NORMAL
Neisseria Gonorrhea: NEGATIVE

## 2021-02-18 NOTE — Progress Notes (Signed)
Patient without acute complaints.    Difficult living situation, was planning to live with his cousin, but he is charging him too much money.  Apparently the patient has been accepted to stay at the [facility], but it is not clear when a bed will be available there.    Labs remain compatible with alcoholic liver disease, including AST/ALT split, thrombocytopenia, macrocytosis.  Ultrasound shows gallstones and a patent TIPS with cirrhotic changes.  Mentally, the patient presents an interesting picture: When I came in the room, he was barely responsive, simply nodding his head yes and no in response to questions.  There is mild to moderate asterixis on exam.  However, as we got talking about his living situation, he actually became quite animated and conversant, and seemed very coherent.  He was able to do serial threes quite quickly and accurately.  He indicates he is having 2 bowel movements per day, so it sounds like the current dose of lactulose is appropriate.  He is also on rifaximin.  Impression:   1.  Alcoholic liver disease with probable element of hepatic encephalopathy  Plan:  1.  Continue current medical management  2.  Consider case management consult if not already obtained, in view of difficult social situation  Florencia Reasons, M.D. Pager [phone] If no answer or after 5 PM call [phone]

## 2021-02-18 NOTE — TOC Initial Note (Signed)
Transition of Care [facility]) - Initial/Assessment Note    Patient Details  Name: Angel Webster MRN: [id number] Date of Birth: 11-23-1960  Transition of Care [facility]) CM/SW Contact:    Ida Rogue, LCSW Phone Number: 02/18/2021, 3:27 PM  Clinical Narrative:   Patient seen in follow up to MD consult for Alcohol use.  Angel Webster presented as lethargic, somewhat somnolent, but continued to engage in conversation even with eyes closed. He minimized alcohol use, saying he can only buy beer when he has money at the begining of the month, otherwise it is only if it is offered to him by a friend.  Angel Webster was much more focused on his current homeless status, stating that he has no place to stay and has recently been staying "outside of the [facility]."  He moved here last November because "this is where my mother's family is from."  He confirmed that staying with family is not an option, has not been able to get into shelter to this point, and that he wants to find a boarding house in which to stay.  He confirms getting a monthly social security check on the 1st, while also confirming that he currently has no money until the 1st of June, rendering him unable to get into a boarding house until then. Angel Ozburn shared that he has been working with "[name]" at the [facility] on trying to get into the shelter.  "I have already been approved."  I left a message for [name] at the [facility] asking her to call me back for coordination of services. TOC will continue to follow during the course of hospitalization.            Expected Discharge Plan: Home/Self Care Barriers to Discharge: No Barriers Identified   Patient Goals and CMS Choice        Expected Discharge Plan and Services Expected Discharge Plan: Home/Self Care In-house Referral: Clinical Social Work     Living arrangements for the past 2 months: No permanent address                                      Prior Living Arrangements/Services Living  arrangements for the past 2 months: No permanent address Lives with:: Self Patient language and need for interpreter reviewed:: Yes        Need for Family Participation in Patient Care: No (Comment) Care giver support system in place?: No (comment)   Criminal Activity/Legal Involvement Pertinent to Current Situation/Hospitalization: No - Comment as needed  Activities of Daily Living Home Assistive Devices/Equipment: None ADL Screening (condition at time of admission) Patient's cognitive ability adequate to safely complete daily activities?: Yes Is the patient deaf or have difficulty hearing?: No Does the patient have difficulty seeing, even when wearing glasses/contacts?: No Does the patient have difficulty concentrating, remembering, or making decisions?: No Patient able to express need for assistance with ADLs?: Yes Does the patient have difficulty dressing or bathing?: No Independently performs ADLs?: Yes (appropriate for developmental age) Does the patient have difficulty walking or climbing stairs?: No Weakness of Legs: None Weakness of Arms/Hands: None  Permission Sought/Granted Permission sought to share information with : Case Manager Permission granted to share information with : Yes, Verbal Permission Granted  Share Information with NAME: [facility]  Ms Benita [phone]           Emotional Assessment Appearance:: Appears stated age  Attitude/Demeanor/Rapport: Lethargic Affect (typically observed): Appropriate Orientation: : Oriented to Self,Oriented to Place,Oriented to Situation Alcohol / Substance Use: Alcohol Use Psych Involvement: No (comment)  Admission diagnosis:  Hepatic encephalopathy (HCC) [K72.90] Right testicular pain [N50.811] Acute pain of left knee [M25.562] Patient Active Problem List   Diagnosis Date Noted  . Cellulitis 02/17/2021  . Thrombocytopenia (HCC)   . Alcoholic cirrhosis (HCC)   . Hepatic encephalopathy (HCC) 11/01/2020   PCP:  Grayce Sessions, NP Pharmacy:   [id number] - [place], [place] - [address] AT [facility] [address] [facility] [address] Phone: [phone] Fax: [phone]     Social Determinants of Health (SDOH) Interventions    Readmission Risk Interventions No flowsheet data found.

## 2021-02-18 NOTE — Progress Notes (Signed)
[facility]  PROGRESS NOTE  Angel Webster RFF:[id number] DOB: Aug 16, 1961 DOA: 02/16/2021 PCP: Kerin Perna, NP   Brief HPI:   60 year old male with history of homelessness, hypertension, asthma, chronic thrombocytopenia, liver cirrhosis, alcohol abuse who was brought by EMS from [employer] with right testicular pain for 3 days.  Also had left knee pain for 1 to 2 days.  Scrotal ultrasound with Doppler was unremarkable.  Ammonia level was 110.  Alk phos 204, AST 124, ALT 74.  Total bili 5.6.  He was also found to have bilateral upper extremity nonpurulent cellulitis and started on ceftriaxone.   Subjective   Patient seen and examined, feels better this morning.  Denies abdominal pain.  Testicular pain has improved.   Assessment/Plan:    1. Acute hepatic encephalopathy-significantly improved.  Ammonia was 110.  Continue lactulose 30 g 3 times dail, rifaximin.  GI following. 2. Decompensated liver cirrhosis-GI has seen the patient.  Abdominal ultrasound obtained yesterday showed cholelithiasis with associated gallbladder wall thickening which can be seen in setting of chronic liver disease versus acute cholecystitis.  Findings equivocal for acute cholecystitis as no pericholecystic fluid or sonographic Murphy sign reported.  Cirrhosis with patent TIPS.  Management per GI. 3. Bilateral upper extremity nonpurulent cellulitis-continue ceftriaxone. 4. Chronic thrombocytopenia-likely from liver cirrhosis.  Platelet count is 61,000.  Down from 64,000 yesterday.   Scheduled medications:   . folic acid  1 mg Oral Daily  . lactulose  30 g Oral TID  . LORazepam  0-4 mg Oral Q6H   Followed by  . [START ON 02/19/2021] LORazepam  0-4 mg Oral Q12H  . multivitamin with minerals  1 tablet Oral Daily  . rifaximin  550 mg Oral BID  . sodium chloride flush  3 mL Intravenous Q12H  . thiamine  100 mg Oral Daily   Or  . thiamine  100 mg Intravenous Daily     Data Reviewed:   CBG:  No  results for input(s): GLUCAP in the last 168 hours.  SpO2: 99 %    Vitals:   02/18/21 0514 02/18/21 1000 02/18/21 1120 02/18/21 1422  BP: (!) 127/100  121/89 (!) 140/91  Pulse: 90  92 88  Resp: _0 Temp: 98.2 F (36.8 C)   (!) 97.5 F (36.4 C)  TempSrc: Oral     SpO2: 100%   99%  Weight:      Height:         Intake/Output Summary (Last 24 hours) at 02/18/2021 1437 Last data filed at 02/18/2021 0300 Gross per 24 hour  Intake 100 ml  Output 650 ml  Net -550 ml    05/21 1901 - 05/23 0700 In: 100  Out: 650 [Urine:650]  Filed Weights   02/16/21 2244  Weight: 124.7 kg    CBC:  Recent Labs  Lab 02/17/21 0147 02/18/21 0538  WBC 8.3 8.1  HGB 12.7* 12.8*  HCT 36.8* 37.3*  PLT 64* 61*  MCV 100.3* 102.5*  MCH 34.6* 35.2*  MCHC 34.5 34.3  RDW 15.9* 16.2*  LYMPHSABS 1.7  --   MONOABS 1.4*  --   EOSABS 0.1  --   BASOSABS 0.1  --     Complete metabolic panel:  Recent Labs  Lab 02/17/21 0147 02/18/21 0538  NA 138 134*  K 4.7 3.8  CL 107 104  CO2 26 25  GLUCOSE 135* 144*  BUN 12 8  CREATININE 0.95 0.79  CALCIUM 8.6* 8.4*  AST 124* 127*  ALT 74* 74*  ALKPHOS 204* 239*  BILITOT 5.6* 4.4*  ALBUMIN 2.4* 2.1*  INR 1.7*  --   AMMONIA 110*  --     No results for input(s): LIPASE, AMYLASE in the last 168 hours.  Recent Labs  Lab 02/17/21 0327  SARSCOV2NAA NEGATIVE    ------------------------------------------------------------------------------------------------------------------ No results for input(s): CHOL, HDL, LDLCALC, TRIG, CHOLHDL, LDLDIRECT in the last 72 hours.  No results found for: HGBA1C ------------------------------------------------------------------------------------------------------------------ No results for input(s): TSH, T4TOTAL, T3FREE, THYROIDAB in the last 72 hours.  Invalid input(s): FREET3 ------------------------------------------------------------------------------------------------------------------ No results for  input(s): VITAMINB12, FOLATE, FERRITIN, TIBC, IRON, RETICCTPCT in the last 72 hours.  Coagulation profile Recent Labs  Lab 02/17/21 0147  INR 1.7*   No results for input(s): DDIMER in the last 72 hours.  Cardiac Enzymes No results for input(s): CKTOTAL, CKMB, CKMBINDEX, TROPONINI in the last 168 hours.  ------------------------------------------------------------------------------------------------------------------    Component Value Date/Time   BNP 21.2 02/05/2021 0530     Antibiotics: Anti-infectives (From admission, onward)   Start     Dose/Rate Route Frequency Ordered Stop   02/17/21 1400  rifaximin (XIFAXAN) tablet 550 mg        550 mg Oral 2 times daily 02/17/21 1245     02/17/21 0900  cefTRIAXone (ROCEPHIN) 2 g in sodium chloride 0.9 % 100 mL IVPB        2 g 200 mL/hr over 30 Minutes Intravenous Daily 02/17/21 0821         Radiology Reports  DG Knee Complete 4 Views Left  Result Date: 02/17/2021 CLINICAL DATA:  Knee pain. EXAM: LEFT KNEE - COMPLETE 4+ VIEW COMPARISON:  None. FINDINGS: No evidence of fracture, dislocation, or joint effusion. Enthesopathy of the patella superiorly and inferiorly. Nonaggressive appearing periosteal reaction along the proximal tibia. Mild tibiofemoral joint space narrowing. No aggressive appearing focal bone abnormality. Soft tissues are unremarkable. IMPRESSION: No acute displaced fracture or dislocation. Electronically Signed   By: Iven Finn M.D.   On: 02/17/2021 00:49   US SCROTUM W/DOPPLER  Result Date: 02/17/2021 CLINICAL DATA:  60 year old with right testicular pain. EXAM: SCROTAL ULTRASOUND DOPPLER ULTRASOUND OF THE TESTICLES TECHNIQUE: Complete ultrasound examination of the testicles, epididymis, and other scrotal structures was performed. Color and spectral Doppler ultrasound were also utilized to evaluate blood flow to the testicles. COMPARISON:  None. FINDINGS: Right testicle Measurements: 4.2 x 2.5 x 2.7 cm. Homogeneous  echogenicity. Normal blood flow. No mass or microlithiasis visualized. Left testicle Measurements: 4.0 x 2.4 x 2.6 cm. Homogeneous echogenicity. Normal blood flow. No mass or microlithiasis visualized. Right epididymis:  Normal in size and appearance. Left epididymis:  Normal in size and appearance. Hydrocele:  None visualized. Varicocele:  None visualized. Pulsed Doppler interrogation of both testes demonstrates normal low resistance arterial and venous waveforms bilaterally. Other: Technically challenging exam due to patient movement and medical condition. IMPRESSION: Unremarkable scrotal ultrasound. Electronically Signed   By: Keith Rake M.D.   On: 02/17/2021 03:30   US Abdomen Limited RUQ (LIVER/GB)  Result Date: 02/18/2021 CLINICAL DATA:  Elevated liver enzymes. EXAM: ULTRASOUND ABDOMEN LIMITED RIGHT UPPER QUADRANT COMPARISON:  None. FINDINGS: Gallbladder: Calcified gallstones within the gallbladder lumen. Gallbladder wall thickening. No pericholecystic fluid visualized. No sonographic Murphy sign noted by sonographer. Common bile duct: Diameter: 4 mm. Liver: Nodular hepatic contour. No focal lesion identified. Coarsened and decrease parenchymal echogenicity. Portal vein is patent on color Doppler imaging with normal direction of blood flow towards the liver. Other: Tips is noted and is  patent. IMPRESSION: 1. Cholelithiasis with associated gallbladder wall thickening which can be seen in the setting of chronic liver disease versus acute cholecystitis. Findings equivocal for acute cholecystitis with no pericholecystic fluid or sonographic Murphy sign recorded. Correlate clinically. 2. Cirrhosis with patent TIPS. Consider nonemergent MRI liver protocol further evaluation. Electronically Signed   By: Iven Finn M.D.   On: 02/18/2021 02:42      DVT prophylaxis: SCDs  Code Status: Full code  Family Communication: No family at  bedside   Consultants:  Gastroenterology  Procedures:      Objective    Physical Examination:    General-appears in no acute distress  Heart-S1-S2, regular, no murmur auscultated  Lungs-clear to auscultation bilaterally, no wheezing or crackles auscultated  Abdomen-soft, nontender, no organomegaly  Extremities-no edema in the lower extremities  Neuro-alert, oriented x3, no focal deficit noted   Status is: Inpatient  Dispo: The patient is from: Home              Anticipated d/c is to: Home              Anticipated d/c date is: 02/20/2021              Patient currently not stable for discharge  Barrier to discharge-ongoing management for hepatic encephalopathy  COVID-19 Labs  No results for input(s): DDIMER, FERRITIN, LDH, CRP in the last 72 hours.  Lab Results  Component Value Date   [place] NEGATIVE 02/17/2021   [place] NEGATIVE 11/02/2020    Microbiology  Recent Results (from the past 240 hour(s))  Resp Panel by RT-PCR (Flu A&B, Covid) Nasopharyngeal Swab     Status: None   Collection Time: 02/17/21  3:27 AM   Specimen: Nasopharyngeal Swab; Nasopharyngeal(NP) swabs in vial transport medium  Result Value Ref Range Status   SARS Coronavirus 2 by RT PCR NEGATIVE NEGATIVE Final    Comment: (NOTE) SARS-CoV-2 target nucleic acids are NOT DETECTED.  The SARS-CoV-2 RNA is generally detectable in upper respiratory specimens during the acute phase of infection. The lowest concentration of SARS-CoV-2 viral copies this assay can detect is 138 copies/mL. A negative result does not preclude SARS-Cov-2 infection and should not be used as the sole basis for treatment or other patient management decisions. A negative result may occur with  improper specimen collection/handling, submission of specimen other than nasopharyngeal swab, presence of viral mutation(s) within the areas targeted by this assay, and inadequate number of viral copies(<138  copies/mL). A negative result must be combined with clinical observations, patient history, and epidemiological information. The expected result is Negative.  Fact Sheet for Patients:  EntrepreneurPulse.com.au  Fact Sheet for Healthcare Providers:  IncredibleEmployment.be  This test is no t yet approved or cleared by the [place] FDA and  has been authorized for detection and/or diagnosis of SARS-CoV-2 by FDA under an Emergency Use Authorization (EUA). This EUA will remain  in effect (meaning this test can be used) for the duration of the COVID-19 declaration under Section 564(b)(1) of the Act, 21 U.S.C.section 360bbb-3(b)(1), unless the authorization is terminated  or revoked sooner.       Influenza A by PCR NEGATIVE NEGATIVE Final   Influenza B by PCR NEGATIVE NEGATIVE Final    Comment: (NOTE) The Xpert Xpress SARS-CoV-2/FLU/RSV plus assay is intended as an aid in the diagnosis of influenza from Nasopharyngeal swab specimens and should not be used as a sole basis for treatment. Nasal washings and aspirates are unacceptable for Xpert Xpress SARS-CoV-2/FLU/RSV testing.  Fact Sheet for Patients: EntrepreneurPulse.com.au  Fact Sheet for Healthcare Providers: IncredibleEmployment.be  This test is not yet approved or cleared by the [place] FDA and has been authorized for detection and/or diagnosis of SARS-CoV-2 by FDA under an Emergency Use Authorization (EUA). This EUA will remain in effect (meaning this test can be used) for the duration of the COVID-19 declaration under Section 564(b)(1) of the Act, 21 U.S.C. section 360bbb-3(b)(1), unless the authorization is terminated or revoked.  Performed at [facility], [place] [address], [place], [place] [address]        Oswald Hillock   [facility] If 7PM-7AM, please contact night-coverage at [email or web address], Office   [phone]   02/18/2021, 2:37 PM  LOS: 1 day

## 2021-02-19 DIAGNOSIS — K7290 Hepatic failure, unspecified without coma: Secondary | ICD-10-CM | POA: Diagnosis not present

## 2021-02-19 DIAGNOSIS — Z5900 Homelessness unspecified: Secondary | ICD-10-CM

## 2021-02-19 DIAGNOSIS — K7030 Alcoholic cirrhosis of liver without ascites: Secondary | ICD-10-CM

## 2021-02-19 DIAGNOSIS — D696 Thrombocytopenia, unspecified: Secondary | ICD-10-CM | POA: Diagnosis not present

## 2021-02-19 DIAGNOSIS — R748 Abnormal levels of other serum enzymes: Secondary | ICD-10-CM | POA: Diagnosis not present

## 2021-02-19 LAB — CBC
HCT: 38.4 % — ABNORMAL LOW (ref 39.0–52.0)
Hemoglobin: 13 g/dL (ref 13.0–17.0)
MCH: 34.9 pg — ABNORMAL HIGH (ref 26.0–34.0)
MCHC: 33.9 g/dL (ref 30.0–36.0)
MCV: 102.9 fL — ABNORMAL HIGH (ref 80.0–100.0)
Platelets: 60 10*3/uL — ABNORMAL LOW (ref 150–400)
RBC: 3.73 MIL/uL — ABNORMAL LOW (ref 4.22–5.81)
RDW: 16.4 % — ABNORMAL HIGH (ref 11.5–15.5)
WBC: 8.5 10*3/uL (ref 4.0–10.5)
nRBC: 0 % (ref 0.0–0.2)

## 2021-02-19 LAB — COMPREHENSIVE METABOLIC PANEL
ALT: 77 U/L — ABNORMAL HIGH (ref 0–44)
AST: 132 U/L — ABNORMAL HIGH (ref 15–41)
Albumin: 2.1 g/dL — ABNORMAL LOW (ref 3.5–5.0)
Alkaline Phosphatase: 251 U/L — ABNORMAL HIGH (ref 38–126)
Anion gap: 2 — ABNORMAL LOW (ref 5–15)
BUN: 6 mg/dL (ref 6–20)
CO2: 27 mmol/L (ref 22–32)
Calcium: 8.5 mg/dL — ABNORMAL LOW (ref 8.9–10.3)
Chloride: 106 mmol/L (ref 98–111)
Creatinine, Ser: 0.66 mg/dL (ref 0.61–1.24)
GFR, Estimated: >60 mL/min (ref >60)
Glucose, Bld: 142 mg/dL — ABNORMAL HIGH (ref 70–99)
Potassium: 4.4 mmol/L (ref 3.5–5.1)
Sodium: 135 mmol/L (ref 135–145)
Total Bilirubin: 4.5 mg/dL — ABNORMAL HIGH (ref 0.3–1.2)
Total Protein: 6.1 g/dL — ABNORMAL LOW (ref 6.5–8.1)

## 2021-02-19 MED ORDER — HYDROMORPHONE HCL 1 MG/ML IJ SOLN
1.0000 mg | INTRAMUSCULAR | Status: DC | PRN
  Administered 2021-02-19: 1 mg via INTRAVENOUS
  Filled 2021-02-19: qty 1
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
## 2021-02-19 NOTE — Evaluation (Signed)
Physical Therapy One Time Evaluation Patient Details Name: Angel Webster MRN: [id number] DOB: 1960-11-09 Today's Date: 02/19/2021   History of Present Illness  60 year old male with history of homelessness, hypertension, asthma, chronic thrombocytopenia, liver cirrhosis, alcohol abuse who was brought by EMS from [facility] with right testicular pain for 3 days.  Also had left knee pain for 1 to 2 days.  He was also found to have bilateral upper extremity nonpurulent cellulitis and started on ceftriaxone.  Clinical Impression  Patient evaluated by Physical Therapy with no further acute PT needs identified. All education has been completed and the patient has no further questions. Pt mobilizing well and plans to ambulate ad lib during hospital stay.  See below for any follow-up Physical Therapy or equipment needs. PT is signing off. Thank you for this referral.        Follow Up Recommendations No PT follow up    Equipment Recommendations  Rolling walker with 5" wheels    Recommendations for Other Services       Precautions / Restrictions Precautions Precautions: None      Mobility  Bed Mobility Overal bed mobility: Modified Independent                  Transfers Overall transfer level: Modified independent                  Ambulation/Gait Ambulation/Gait assistance: Modified independent (Device/Increase time) Gait Distance (Feet): 300 Feet Assistive device: Rolling walker (2 wheeled) Gait Pattern/deviations: Step-through pattern;Decreased stride length     General Gait Details: prefers RW for more stability and to assist with pain (reports he was provided with underwear that was too small as resources did not have his size)  Careers information officer    Modified Rankin (Stroke Patients Only)       Balance Overall balance assessment: No apparent balance deficits (not formally assessed)                                            Pertinent Vitals/Pain Pain Assessment: No/denies pain    Home Living Family/patient expects to be discharged to:: Shelter/Homeless                      Prior Function Level of Independence: Independent               Hand Dominance        Extremity/Trunk Assessment   Upper Extremity Assessment Upper Extremity Assessment: Overall WFL for tasks assessed    Lower Extremity Assessment Lower Extremity Assessment: Overall WFL for tasks assessed    Cervical / Trunk Assessment Cervical / Trunk Assessment: Normal  Communication   Communication: No difficulties  Cognition Arousal/Alertness: Awake/alert Behavior During Therapy: WFL for tasks assessed/performed Overall Cognitive Status: Within Functional Limits for tasks assessed                                        General Comments      Exercises     Assessment/Plan    PT Assessment Patent does not need any further PT services  PT Problem List         PT Treatment Interventions  PT Goals (Current goals can be found in the Care Plan section)  Acute Rehab PT Goals PT Goal Formulation: All assessment and education complete, DC therapy    Frequency     Barriers to discharge        Co-evaluation               AM-PAC PT "6 Clicks" Mobility  Outcome Measure Help needed turning from your back to your side while in a flat bed without using bedrails?: None Help needed moving from lying on your back to sitting on the side of a flat bed without using bedrails?: None Help needed moving to and from a bed to a chair (including a wheelchair)?: None Help needed standing up from a chair using your arms (e.g., wheelchair or bedside chair)?: None Help needed to walk in hospital room?: A Little Help needed climbing 3-5 steps with a railing? : A Little 6 Click Score: 22    End of Session   Activity Tolerance: Patient tolerated treatment well Patient left: in  bed;with call bell/phone within reach Nurse Communication: Mobility status PT Visit Diagnosis: Difficulty in walking, not elsewhere classified (R26.2)    Time: 2585-2778 PT Time Calculation (min) (ACUTE ONLY): 21 min   Charges:   PT Evaluation $PT Eval Low Complexity: 1 Low         Kati PT, DPT Acute Rehabilitation Services Pager: [phone] Office: [phone]  Maida Sale E 02/19/2021, 3:26 PM

## 2021-02-19 NOTE — Progress Notes (Signed)
[facility] Gastroenterology Progress Note  Angel Webster 60 y.o. 1961-01-27  CC:  Cirrhosis  Subjective: Patient denies complaints.  Denies abdominal pain, nausea, or vomiting. Continues to have BMs on lactulose.   ROS : Review of Systems  Cardiovascular: Negative for chest pain and palpitations.  Gastrointestinal: Negative for abdominal pain, blood in stool, constipation, diarrhea, heartburn, melena, nausea and vomiting.    Objective: Vital signs in last 24 hours: Vitals:   02/18/21 2104 02/19/21 0527  BP: 128/76 116/83  Pulse: 99 87  Resp: 20 16  Temp: 98.6 F (37 C) 98.3 F (36.8 C)  SpO2: 99% 94%    Physical Exam:  General:  Lethargic but oriented and cooperative, no distress  Head:  Normocephalic, without obvious abnormality, atraumatic  Eyes:  Scleral icterus, EOMs intact  Lungs:   Clear to auscultation bilaterally, respirations unlabored  Heart:  Regular rate and rhythm, S1, S2 normal  Abdomen:   Soft, non-tender, non-distended, bowel sounds active all four quadrants  Extremities: + bilateral lower extremity edema    Lab Results: Recent Labs    02/18/21 0538 02/19/21 0513  NA 134* 135  K 3.8 4.4  CL 104 106  CO2 25 27  GLUCOSE 144* 142*  BUN 8 6  CREATININE 0.79 0.66  CALCIUM 8.4* 8.5*   Recent Labs    02/18/21 0538 02/19/21 0513  AST 127* 132*  ALT 74* 77*  ALKPHOS 239* 251*  BILITOT 4.4* 4.5*  PROT 5.7* 6.1*  ALBUMIN 2.1* 2.1*   Recent Labs    02/17/21 0147 02/18/21 0538 02/19/21 0513  WBC 8.3 8.1 8.5  NEUTROABS 5.0  --   --   HGB 12.7* 12.8* 13.0  HCT 36.8* 37.3* 38.4*  MCV 100.3* 102.5* 102.9*  PLT 64* 61* 60*   Recent Labs    02/17/21 0147  LABPROT 19.6*  INR 1.7*      Assessment: Alcohol-related cirrhosis, hepatic encephalopathy -T. Bili 4.5/ AST 132/ ALT 77/ ALP 251 -Platelets 70K/uL -INR 1.7 as of 5/22 -No leukocytosis  Plan: Continue lactulose, titrated for 2-3 BMs/day. Continue Rifaximin.  Continue 2g sodium  restricted diet.  Consider outpatient EGD for variceal screening.  Patient should arrange FU at [facility] after discharge.  [facility] will sign off.  Please contact us if we can be of any further assistance during this hospital stay.  Edrick Kins PA-C 02/19/2021, 12:04 PM  Contact #  [phone]

## 2021-02-19 NOTE — Progress Notes (Signed)
[facility]  PROGRESS NOTE  Angel Webster GGY:[id number] DOB: 1961/01/22 DOA: 02/16/2021 PCP: Grayce Sessions, NP   Brief HPI:   60 year old male with history of homelessness, hypertension, asthma, chronic thrombocytopenia, liver cirrhosis, alcohol abuse who was brought by EMS from [facility] with right testicular pain for 3 days.  Also had left knee pain for 1 to 2 days.  Scrotal ultrasound with Doppler was unremarkable.  Ammonia level was 110.  Alk phos 204, AST 124, ALT 74.  Total bili 5.6.  He was also found to have bilateral upper extremity nonpurulent cellulitis and started on ceftriaxone.   Subjective   Patient seen and examined, feels depressed being homeless.  Wants to see a psychiatrist.  Also considering to go to rehab for substance abuse.   Assessment/Plan:    1. Acute hepatic encephalopathy-significantly improved.  Ammonia was 110.  Continue lactulose 30 g 3 times daily, rifaximin.  GI following. 2. Decompensated liver cirrhosis-GI has seen the patient.  Abdominal ultrasound obtained yesterday showed cholelithiasis with associated gallbladder wall thickening which can be seen in setting of chronic liver disease versus acute cholecystitis.  Findings equivocal for acute cholecystitis as no pericholecystic fluid or sonographic Murphy sign reported.  Cirrhosis with patent TIPS.  GI has signed off. 3. Bilateral upper extremity nonpurulent cellulitis-significantly improved, continue ceftriaxone. 4. Chronic thrombocytopenia-likely from liver cirrhosis.  Platelet count is 61,000.  Down from 64,000 yesterday. 5. Depression-we will obtain psych consultation for depression. 6. Substance abuse-social worker has been consulted for finding of rehab as outpatient   Scheduled medications:   . folic acid  1 mg Oral Daily  . lactulose  30 g Oral TID  . LORazepam  0-4 mg Oral Q12H  . multivitamin with minerals  1 tablet Oral Daily  . rifaximin  550 mg Oral BID  . sodium  chloride flush  3 mL Intravenous Q12H  . thiamine  100 mg Oral Daily   Or  . thiamine  100 mg Intravenous Daily     Data Reviewed:   CBG:  No results for input(s): GLUCAP in the last 168 hours.  SpO2: 94 %    Vitals:   02/18/21 1422 02/18/21 1813 02/18/21 2104 02/19/21 0527  BP: (!) 140/91 139/85 128/76 116/83  Pulse: 88 85 99 87  Resp: 18  20 16   Temp: (!) 97.5 F (36.4 C)  98.6 F (37 C) 98.3 F (36.8 C)  TempSrc:      SpO2: 99%  99% 94%  Weight:      Height:         Intake/Output Summary (Last 24 hours) at 02/19/2021 1228 Last data filed at 02/19/2021 1222 Gross per 24 hour  Intake --  Output 1675 ml  Net -1675 ml    05/22 1901 - 05/24 0700 In: 100  Out: 1850 [Urine:1850]  Filed Weights   02/16/21 2244  Weight: 124.7 kg    CBC:  Recent Labs  Lab 02/17/21 0147 02/18/21 0538 02/19/21 0513  WBC 8.3 8.1 8.5  HGB 12.7* 12.8* 13.0  HCT 36.8* 37.3* 38.4*  PLT 64* 61* 60*  MCV 100.3* 102.5* 102.9*  MCH 34.6* 35.2* 34.9*  MCHC 34.5 34.3 33.9  RDW 15.9* 16.2* 16.4*  LYMPHSABS 1.7  --   --   MONOABS 1.4*  --   --   EOSABS 0.1  --   --   BASOSABS 0.1  --   --     Complete metabolic panel:  Recent Labs  Lab 02/17/21  0147 02/18/21 0538 02/19/21 0513  NA 138 134* 135  K 4.7 3.8 4.4  CL 107 104 106  CO2 $Re'26 25 27  'NCO$ GLUCOSE 135* 144* 142*  BUN $Re'12 8 6  'aba$ CREATININE 0.95 0.79 0.66  CALCIUM 8.6* 8.4* 8.5*  AST 124* 127* 132*  ALT 74* 74* 77*  ALKPHOS 204* 239* 251*  BILITOT 5.6* 4.4* 4.5*  ALBUMIN 2.4* 2.1* 2.1*  INR 1.7*  --   --   AMMONIA 110*  --   --     No results for input(s): LIPASE, AMYLASE in the last 168 hours.  Recent Labs  Lab 02/17/21 0327  SARSCOV2NAA NEGATIVE    ------------------------------------------------------------------------------------------------------------------ No results for input(s): CHOL, HDL, LDLCALC, TRIG, CHOLHDL, LDLDIRECT in the last 72 hours.  No results found for:  HGBA1C ------------------------------------------------------------------------------------------------------------------ No results for input(s): TSH, T4TOTAL, T3FREE, THYROIDAB in the last 72 hours.  Invalid input(s): FREET3 ------------------------------------------------------------------------------------------------------------------ No results for input(s): VITAMINB12, FOLATE, FERRITIN, TIBC, IRON, RETICCTPCT in the last 72 hours.  Coagulation profile Recent Labs  Lab 02/17/21 0147  INR 1.7*   No results for input(s): DDIMER in the last 72 hours.  Cardiac Enzymes No results for input(s): CKTOTAL, CKMB, CKMBINDEX, TROPONINI in the last 168 hours.  ------------------------------------------------------------------------------------------------------------------    Component Value Date/Time   BNP 21.2 02/05/2021 0530     Antibiotics: Anti-infectives (From admission, onward)   Start     Dose/Rate Route Frequency Ordered Stop   02/17/21 1400  rifaximin (XIFAXAN) tablet 550 mg        550 mg Oral 2 times daily 02/17/21 1245     02/17/21 0900  cefTRIAXone (ROCEPHIN) 2 g in sodium chloride 0.9 % 100 mL IVPB        2 g 200 mL/hr over 30 Minutes Intravenous Daily 02/17/21 0821         Radiology Reports  US Abdomen Limited RUQ (LIVER/GB)  Result Date: 02/18/2021 CLINICAL DATA:  Elevated liver enzymes. EXAM: ULTRASOUND ABDOMEN LIMITED RIGHT UPPER QUADRANT COMPARISON:  None. FINDINGS: Gallbladder: Calcified gallstones within the gallbladder lumen. Gallbladder wall thickening. No pericholecystic fluid visualized. No sonographic Murphy sign noted by sonographer. Common bile duct: Diameter: 4 mm. Liver: Nodular hepatic contour. No focal lesion identified. Coarsened and decrease parenchymal echogenicity. Portal vein is patent on color Doppler imaging with normal direction of blood flow towards the liver. Other: Tips is noted and is patent. IMPRESSION: 1. Cholelithiasis with associated  gallbladder wall thickening which can be seen in the setting of chronic liver disease versus acute cholecystitis. Findings equivocal for acute cholecystitis with no pericholecystic fluid or sonographic Murphy sign recorded. Correlate clinically. 2. Cirrhosis with patent TIPS. Consider nonemergent MRI liver protocol further evaluation. Electronically Signed   By: Iven Finn M.D.   On: 02/18/2021 02:42      DVT prophylaxis: SCDs  Code Status: Full code  Family Communication: No family at bedside   Consultants:  Gastroenterology  Procedures:      Objective    Physical Examination:   General-appears in no acute distress  Heart-S1-S2, regular, no murmur auscultated  Lungs-clear to auscultation bilaterally, no wheezing or crackles auscultated  Abdomen-soft, nontender, no organomegaly  Extremities-no edema in the lower extremities  Neuro-alert, oriented x3, no focal deficit noted   Status is: Inpatient  Dispo: The patient is from: Home              Anticipated d/c is to: Home              Anticipated  d/c date is: 02/20/2021              Patient currently not stable for discharge  Barrier to discharge-ongoing management for hepatic encephalopathy  COVID-19 Labs  No results for input(s): DDIMER, FERRITIN, LDH, CRP in the last 72 hours.  Lab Results  Component Value Date   [place] NEGATIVE 02/17/2021   [place] NEGATIVE 11/02/2020    Microbiology  Recent Results (from the past 240 hour(s))  Resp Panel by RT-PCR (Flu A&B, Covid) Nasopharyngeal Swab     Status: None   Collection Time: 02/17/21  3:27 AM   Specimen: Nasopharyngeal Swab; Nasopharyngeal(NP) swabs in vial transport medium  Result Value Ref Range Status   SARS Coronavirus 2 by RT PCR NEGATIVE NEGATIVE Final    Comment: (NOTE) SARS-CoV-2 target nucleic acids are NOT DETECTED.  The SARS-CoV-2 RNA is generally detectable in upper respiratory specimens during the acute phase of infection.  The lowest concentration of SARS-CoV-2 viral copies this assay can detect is 138 copies/mL. A negative result does not preclude SARS-Cov-2 infection and should not be used as the sole basis for treatment or other patient management decisions. A negative result may occur with  improper specimen collection/handling, submission of specimen other than nasopharyngeal swab, presence of viral mutation(s) within the areas targeted by this assay, and inadequate number of viral copies(<138 copies/mL). A negative result must be combined with clinical observations, patient history, and epidemiological information. The expected result is Negative.  Fact Sheet for Patients:  EntrepreneurPulse.com.au  Fact Sheet for Healthcare Providers:  IncredibleEmployment.be  This test is no t yet approved or cleared by the [place] FDA and  has been authorized for detection and/or diagnosis of SARS-CoV-2 by FDA under an Emergency Use Authorization (EUA). This EUA will remain  in effect (meaning this test can be used) for the duration of the COVID-19 declaration under Section 564(b)(1) of the Act, 21 U.S.C.section 360bbb-3(b)(1), unless the authorization is terminated  or revoked sooner.       Influenza A by PCR NEGATIVE NEGATIVE Final   Influenza B by PCR NEGATIVE NEGATIVE Final    Comment: (NOTE) The Xpert Xpress SARS-CoV-2/FLU/RSV plus assay is intended as an aid in the diagnosis of influenza from Nasopharyngeal swab specimens and should not be used as a sole basis for treatment. Nasal washings and aspirates are unacceptable for Xpert Xpress SARS-CoV-2/FLU/RSV testing.  Fact Sheet for Patients: EntrepreneurPulse.com.au  Fact Sheet for Healthcare Providers: IncredibleEmployment.be  This test is not yet approved or cleared by the [place] FDA and has been authorized for detection and/or diagnosis of SARS-CoV-2 by FDA  under an Emergency Use Authorization (EUA). This EUA will remain in effect (meaning this test can be used) for the duration of the COVID-19 declaration under Section 564(b)(1) of the Act, 21 U.S.C. section 360bbb-3(b)(1), unless the authorization is terminated or revoked.  Performed at [facility], [place] [address] [place], [place] [address]        Oswald Hillock   [facility] If 7PM-7AM, please contact night-coverage at [email or web address], Office  [phone]   02/19/2021, 12:28 PM  LOS: 2 days

## 2021-02-19 NOTE — TOC Progression Note (Addendum)
Transition of Care [facility]) - Progression Note    Patient Details  Name: Angel Webster MRN: [id number] Date of Birth: 1961/02/08  [facility]) CM/SW Contact  Ida Rogue, [place] Phone Number: 02/19/2021, 11:11 AM  Clinical Narrative:   Patient seen in response to MD consult, treatment team members informing me that patient is asking for help with substance abuse.  When I saw him today, Mr Delfavero focus had shifted from his homeless status to the need for help with substance use, specifically at inpatient rehab.  Mr Ponciano states he had 13 years of sobriety, mainly accomplished by the support/structure of working in a rehab facility in [place], as well as "the fellowship of NA groups."  He relapsed about 2 months ago, says he has been using crack cocaine daily and drinking 3-4 40oz. beers daily.  There is no toxicology report this admission, BAC was 68.  He is motivated to get help because he has "cirrhosis of the liver."  Let him know I would send his information to [facility], a 28 day program here in [facility].  He acknowledged that they would not take him there if he is taking benzos or opiates.  Today he denies SI, HI, psychosis.  Referral sent.  Addendum:  Patient is disqualified from [facility] due to cellulitis of hand and open wound/dressings as they are "not a medical facility."  Referral send to [facility].    Expected Discharge Plan: Home/Self Care Barriers to Discharge: Active Substance Use - Placement  Expected Discharge Plan and Services Expected Discharge Plan: Home/Self Care In-house Referral: Clinical Social Work     Living arrangements for the past 2 months: No permanent address                                       Social Determinants of Health (SDOH) Interventions    Readmission Risk Interventions No flowsheet data found.

## 2021-02-19 NOTE — Progress Notes (Signed)
Pt developed constant pain rated 7/10 across abdomen. MD notified and pain meds ordered.

## 2021-02-20 ENCOUNTER — Inpatient Hospital Stay (HOSPITAL_COMMUNITY): Payer: Medicaid Other

## 2021-02-20 DIAGNOSIS — R609 Edema, unspecified: Secondary | ICD-10-CM

## 2021-02-20 DIAGNOSIS — K7030 Alcoholic cirrhosis of liver without ascites: Secondary | ICD-10-CM | POA: Diagnosis not present

## 2021-02-20 LAB — CBC
HCT: 39.5 % (ref 39.0–52.0)
Hemoglobin: 13.2 g/dL (ref 13.0–17.0)
MCH: 34.6 pg — ABNORMAL HIGH (ref 26.0–34.0)
MCHC: 33.4 g/dL (ref 30.0–36.0)
MCV: 103.4 fL — ABNORMAL HIGH (ref 80.0–100.0)
Platelets: 56 10*3/uL — ABNORMAL LOW (ref 150–400)
RBC: 3.82 MIL/uL — ABNORMAL LOW (ref 4.22–5.81)
RDW: 16.3 % — ABNORMAL HIGH (ref 11.5–15.5)
WBC: 8 10*3/uL (ref 4.0–10.5)
nRBC: 0 % (ref 0.0–0.2)

## 2021-02-20 LAB — COMPREHENSIVE METABOLIC PANEL
ALT: 74 U/L — ABNORMAL HIGH (ref 0–44)
AST: 104 U/L — ABNORMAL HIGH (ref 15–41)
Albumin: 2.1 g/dL — ABNORMAL LOW (ref 3.5–5.0)
Alkaline Phosphatase: 280 U/L — ABNORMAL HIGH (ref 38–126)
Anion gap: 3 — ABNORMAL LOW (ref 5–15)
BUN: 6 mg/dL (ref 6–20)
CO2: 27 mmol/L (ref 22–32)
Calcium: 8.7 mg/dL — ABNORMAL LOW (ref 8.9–10.3)
Chloride: 104 mmol/L (ref 98–111)
Creatinine, Ser: 0.68 mg/dL (ref 0.61–1.24)
GFR, Estimated: >60 mL/min (ref >60)
Glucose, Bld: 181 mg/dL — ABNORMAL HIGH (ref 70–99)
Potassium: 4.4 mmol/L (ref 3.5–5.1)
Sodium: 134 mmol/L — ABNORMAL LOW (ref 135–145)
Total Bilirubin: 5 mg/dL — ABNORMAL HIGH (ref 0.3–1.2)
Total Protein: 5.8 g/dL — ABNORMAL LOW (ref 6.5–8.1)

## 2021-02-20 MED ORDER — ONDANSETRON HCL 4 MG/2ML IJ SOLN
4.0000 mg | Freq: Four times a day (QID) | INTRAMUSCULAR | Status: AC | PRN
  Administered 2021-02-20 – 2021-02-21 (×2): 4 mg via INTRAVENOUS
  Filled 2021-02-20 (×2): qty 2

## 2021-02-20 NOTE — Progress Notes (Signed)
PROGRESS NOTE    Angel Webster  NTI:[id number] DOB: May 20, 1961 DOA: 02/16/2021 PCP: Grayce Sessions, NP    Chief Complaint  Patient presents with  . Testicle Pain  . Knee Pain    Brief Narrative:  60 year old male with history of homelessness, hypertension, asthma, chronic thrombocytopenia, liver cirrhosis, alcohol abuse who was brought by EMS from [facility] with right testicular pain for 3 days.  Also had left knee pain for 1 to 2 days.  Scrotal ultrasound with Doppler was unremarkable.  Ammonia level was 110.  Alk phos 204, AST 124, ALT 74.  Total bili 5.6.  He was also found to have bilateral upper extremity nonpurulent cellulitis and started on ceftriaxone.  Subjective:  Does not appear to have confusion, mild flapping tremor, reports 3bm last 24hrs Bilateral lower extremity edema Denies pain No fever Assessment & Plan:   Principal Problem:   Alcoholic cirrhosis (HCC) Active Problems:   Hepatic encephalopathy (HCC)   Thrombocytopenia (HCC)   Cellulitis   Homelessness   Acute hepatic encephalopathy -Improved on lactulose rifaximin  Decompensated liver cirrhosis/chronic thrombocytopenia Abdominal ultrasound showed cholelithiasis with gallbladder wall thickening could be seen in the setting of chronic liver disease versus acute cholecystitis Cirrhosis with patent TIPS Patient denies abdominal pain, GI signed off  Bilateral upper extremity nonpurulent cellulitis Improved on antibiotic, continue  Depression Psychiatry consulted, he does not meet inpatient psychiatric placement, psychiatry signed off  Alcohol abuse Alcohol level on presentation 29 Social worker consulted   Class III obesity The patient's BMI is: Body mass index is 40.61 kg/m.[facility] (From admission, onward)          Start     Ordered   02/18/21 0500  CBC  Daily,   R      02/17/21 0821   02/18/21 0500  Comprehensive metabolic panel  Daily,   R      02/17/21 0821             DVT prophylaxis: Place and maintain sequential compression device Start: 02/17/21 8676   Code Status: Full Family Communication: Patient Disposition:   Status is: Inpatient   Dispo: The patient is from: No permanent address for the last 33-month              Anticipated d/c is to: To be determined              Anticipated d/c date is: Medically stable to discharge, social worker to help with placement                Consultants:   Psychiatry  GI  Procedures:   None  Antimicrobials:   Anti-infectives (From admission, onward)   Start     Dose/Rate Route Frequency Ordered Stop   02/17/21 1400  rifaximin (XIFAXAN) tablet 550 mg        550 mg Oral 2 times daily 02/17/21 1245     02/17/21 0900  cefTRIAXone (ROCEPHIN) 2 g in sodium chloride 0.9 % 100 mL IVPB        2 g 200 mL/hr over 30 Minutes Intravenous Daily 02/17/21 0821            Objective: Vitals:   02/19/21 0527 02/19/21 1408 02/19/21 2106 02/20/21 0506  BP: 116/83 125/80 113/71 110/65  Pulse: 87 (!) 108 84 87  Resp: 16 18 18 18   Temp: 98.3 F (36.8 C) 98 F (36.7 C) 98.6 F (37 C) 98.3 F (36.8 C)  TempSrc:  Oral    SpO2: 94% 97% 98% 97%  Weight:      Height:        Intake/Output Summary (Last 24 hours) at 02/20/2021 1149 Last data filed at 02/20/2021 0915 Gross per 24 hour  Intake 360 ml  Output 2400 ml  Net -2040 ml   Filed Weights   02/16/21 2244  Weight: 124.7 kg    Examination:  General exam: calm, NAD Respiratory system: Clear to auscultation. Respiratory effort normal. Cardiovascular system: S1 & S2 heard, RRR. No JVD, no murmur, No pedal edema. Gastrointestinal system: Abdomen is nondistended, soft and nontender. Normal bowel sounds heard. Central nervous system: Alert and oriented. No focal neurological deficits. Extremities: Bilateral lower extremity pitting edema Skin: Vitiligo skin changes Psychiatry: Judgement and insight appear normal. Mood & affect  appropriate.     Data Reviewed: I have personally reviewed following labs and imaging studies  CBC: Recent Labs  Lab 02/17/21 0147 02/18/21 0538 02/19/21 0513 02/20/21 0522  WBC 8.3 8.1 8.5 8.0  NEUTROABS 5.0  --   --   --   HGB 12.7* 12.8* 13.0 13.2  HCT 36.8* 37.3* 38.4* 39.5  MCV 100.3* 102.5* 102.9* 103.4*  PLT 64* 61* 60* 56*    Basic Metabolic Panel: Recent Labs  Lab 02/17/21 0147 02/18/21 0538 02/19/21 0513 02/20/21 0522  NA 138 134* 135 134*  K 4.7 3.8 4.4 4.4  CL 107 104 106 104  CO2 $Re'26 25 27 27  'Cvz$ GLUCOSE 135* 144* 142* 181*  BUN $Re'12 8 6 6  'LwR$ CREATININE 0.95 0.79 0.66 0.68  CALCIUM 8.6* 8.4* 8.5* 8.7*    GFR: Estimated Creatinine Clearance: 131.4 mL/min (by C-G formula based on SCr of 0.68 mg/dL).  Liver Function Tests: Recent Labs  Lab 02/17/21 0147 02/18/21 0538 02/19/21 0513 02/20/21 0522  AST 124* 127* 132* 104*  ALT 74* 74* 77* 74*  ALKPHOS 204* 239* 251* 280*  BILITOT 5.6* 4.4* 4.5* 5.0*  PROT 6.6 5.7* 6.1* 5.8*  ALBUMIN 2.4* 2.1* 2.1* 2.1*    CBG: No results for input(s): GLUCAP in the last 168 hours.   Recent Results (from the past 240 hour(s))  Resp Panel by RT-PCR (Flu A&B, Covid) Nasopharyngeal Swab     Status: None   Collection Time: 02/17/21  3:27 AM   Specimen: Nasopharyngeal Swab; Nasopharyngeal(NP) swabs in vial transport medium  Result Value Ref Range Status   SARS Coronavirus 2 by RT PCR NEGATIVE NEGATIVE Final    Comment: (NOTE) SARS-CoV-2 target nucleic acids are NOT DETECTED.  The SARS-CoV-2 RNA is generally detectable in upper respiratory specimens during the acute phase of infection. The lowest concentration of SARS-CoV-2 viral copies this assay can detect is 138 copies/mL. A negative result does not preclude SARS-Cov-2 infection and should not be used as the sole basis for treatment or other patient management decisions. A negative result may occur with  improper specimen collection/handling, submission of  specimen other than nasopharyngeal swab, presence of viral mutation(s) within the areas targeted by this assay, and inadequate number of viral copies(<138 copies/mL). A negative result must be combined with clinical observations, patient history, and epidemiological information. The expected result is Negative.  Fact Sheet for Patients:  EntrepreneurPulse.com.au  Fact Sheet for Healthcare Providers:  IncredibleEmployment.be  This test is no t yet approved or cleared by the [place] FDA and  has been authorized for detection and/or diagnosis of SARS-CoV-2 by FDA under an Emergency Use Authorization (EUA). This EUA will remain  in effect (  meaning this test can be used) for the duration of the COVID-19 declaration under Section 564(b)(1) of the Act, 21 U.S.C.section 360bbb-3(b)(1), unless the authorization is terminated  or revoked sooner.       Influenza A by PCR NEGATIVE NEGATIVE Final   Influenza B by PCR NEGATIVE NEGATIVE Final    Comment: (NOTE) The Xpert Xpress SARS-CoV-2/FLU/RSV plus assay is intended as an aid in the diagnosis of influenza from Nasopharyngeal swab specimens and should not be used as a sole basis for treatment. Nasal washings and aspirates are unacceptable for Xpert Xpress SARS-CoV-2/FLU/RSV testing.  Fact Sheet for Patients: EntrepreneurPulse.com.au  Fact Sheet for Healthcare Providers: IncredibleEmployment.be  This test is not yet approved or cleared by the [place] FDA and has been authorized for detection and/or diagnosis of SARS-CoV-2 by FDA under an Emergency Use Authorization (EUA). This EUA will remain in effect (meaning this test can be used) for the duration of the COVID-19 declaration under Section 564(b)(1) of the Act, 21 U.S.C. section 360bbb-3(b)(1), unless the authorization is terminated or revoked.  Performed at [facility], [place]  [address], [place],  [address]          Radiology Studies: No results found.      Scheduled Meds: . folic acid  1 mg Oral Daily  . lactulose  30 g Oral TID  . LORazepam  0-4 mg Oral Q12H  . multivitamin with minerals  1 tablet Oral Daily  . rifaximin  550 mg Oral BID  . sodium chloride flush  3 mL Intravenous Q12H  . thiamine  100 mg Oral Daily   Or  . thiamine  100 mg Intravenous Daily   Continuous Infusions: . cefTRIAXone (ROCEPHIN)  IV 2 g (02/20/21 0912)     LOS: 3 days   Time spent: 76mins Greater than 50% of this time was spent in counseling, explanation of diagnosis, planning of further management, and coordination of care.   Voice Recognition Viviann Spare dictation system was used to create this note, attempts have been made to correct errors. Please contact the author with questions and/or clarifications.   Florencia Reasons, MD PhD FACP [facility]  Available via Epic secure chat 7am-7pm for nonurgent issues Please page for urgent issues To page the attending provider between 7A-7P or the covering provider during after hours 7P-7A, please log into the web site [email or web address] and access using universal [place] password for that web site. If you do not have the password, please call the hospital operator.    02/20/2021, 11:49 AM

## 2021-02-20 NOTE — Progress Notes (Signed)
Bilateral lower extremity venous duplex has been completed. Preliminary results can be found in CV Proc through chart review.   02/20/21 1:15 PM Olen Cordial RVT

## 2021-02-20 NOTE — TOC Progression Note (Signed)
Transition of Care [facility]) - Progression Note    Patient Details  Name: Angel Webster MRN: [id number] Date of Birth: 06/10/1961  [facility]) CM/SW Contact  Ida Rogue, [place] Phone Number: 02/20/2021, 1:54 PM  Clinical Narrative:   Followed up with [facility] about referral.  Referral received, not yet reviewed. TOC will continue to follow during the course of hospitalization.     Expected Discharge Plan: Home/Self Care Barriers to Discharge: Active Substance Use - Placement  Expected Discharge Plan and Services Expected Discharge Plan: Home/Self Care In-house Referral: Clinical Social Work     Living arrangements for the past 2 months: No permanent address                                       Social Determinants of Health (SDOH) Interventions    Readmission Risk Interventions No flowsheet data found.

## 2021-02-21 DIAGNOSIS — K7030 Alcoholic cirrhosis of liver without ascites: Secondary | ICD-10-CM | POA: Diagnosis not present

## 2021-02-21 LAB — COMPREHENSIVE METABOLIC PANEL
ALT: 66 U/L — ABNORMAL HIGH (ref 0–44)
AST: 87 U/L — ABNORMAL HIGH (ref 15–41)
Albumin: 2 g/dL — ABNORMAL LOW (ref 3.5–5.0)
Alkaline Phosphatase: 244 U/L — ABNORMAL HIGH (ref 38–126)
Anion gap: 4 — ABNORMAL LOW (ref 5–15)
BUN: 7 mg/dL (ref 6–20)
CO2: 26 mmol/L (ref 22–32)
Calcium: 8.5 mg/dL — ABNORMAL LOW (ref 8.9–10.3)
Chloride: 103 mmol/L (ref 98–111)
Creatinine, Ser: 0.59 mg/dL — ABNORMAL LOW (ref 0.61–1.24)
GFR, Estimated: >60 mL/min (ref >60)
Glucose, Bld: 189 mg/dL — ABNORMAL HIGH (ref 70–99)
Potassium: 4.3 mmol/L (ref 3.5–5.1)
Sodium: 133 mmol/L — ABNORMAL LOW (ref 135–145)
Total Bilirubin: 5.4 mg/dL — ABNORMAL HIGH (ref 0.3–1.2)
Total Protein: 5.6 g/dL — ABNORMAL LOW (ref 6.5–8.1)

## 2021-02-21 LAB — CBC
HCT: 38.1 % — ABNORMAL LOW (ref 39.0–52.0)
Hemoglobin: 13.1 g/dL (ref 13.0–17.0)
MCH: 34.9 pg — ABNORMAL HIGH (ref 26.0–34.0)
MCHC: 34.4 g/dL (ref 30.0–36.0)
MCV: 101.6 fL — ABNORMAL HIGH (ref 80.0–100.0)
Platelets: 59 10*3/uL — ABNORMAL LOW (ref 150–400)
RBC: 3.75 MIL/uL — ABNORMAL LOW (ref 4.22–5.81)
RDW: 16.3 % — ABNORMAL HIGH (ref 11.5–15.5)
WBC: 8.7 10*3/uL (ref 4.0–10.5)
nRBC: 0 % (ref 0.0–0.2)

## 2021-02-21 MED ORDER — FUROSEMIDE 40 MG PO TABS
40.0000 mg | ORAL_TABLET | Freq: Two times a day (BID) | ORAL | Status: DC
  Administered 2021-02-21 – 2021-02-27 (×12): 40 mg via ORAL
  Filled 2021-02-21 (×12): qty 1

## 2021-02-21 MED ORDER — SPIRONOLACTONE 100 MG PO TABS
100.0000 mg | ORAL_TABLET | Freq: Every day | ORAL | Status: DC
  Administered 2021-02-21 – 2021-02-27 (×7): 100 mg via ORAL
  Filled 2021-02-21 (×7): qty 1

## 2021-02-21 NOTE — TOC Progression Note (Signed)
Transition of Care [facility]) - Progression Note    Patient Details  Name: Angel Webster MRN: [id number] Date of Birth: 1960-10-02  Transition of Care [facility]) CM/SW Contact  Ida Rogue, [place] Phone Number: 02/21/2021, 3:58 PM  Clinical Narrative:   Informed patient that they are still reviewing his chart at [facility], and even if they find him appropriate, will likely not be able to take him until next week.  Gave him contact name and number with whom to follow up as they will take him even if he is not in hospital.  Gave him number to call shelter to see if he is on waiting list which ups his chances of getting in tomorrow.  Let him know he will likely be discharge tomorrow. TOC will continue to follow during the course of hospitalization.     Expected Discharge Plan: Home/Self Care Barriers to Discharge: Active Substance Use - Placement  Expected Discharge Plan and Services Expected Discharge Plan: Home/Self Care In-house Referral: Clinical Social Work     Living arrangements for the past 2 months: No permanent address                                       Social Determinants of Health (SDOH) Interventions    Readmission Risk Interventions No flowsheet data found.

## 2021-02-21 NOTE — Progress Notes (Signed)
PROGRESS NOTE    Angel Webster  NUU:[id number] DOB: 08-Aug-1961 DOA: 02/16/2021 PCP: Kerin Perna, NP    Chief Complaint  Patient presents with  . Testicle Pain  . Knee Pain    Brief Narrative:  60 year old male with history of homelessness, hypertension, asthma, chronic thrombocytopenia, liver cirrhosis, alcohol abuse who was brought by EMS from [employer] with right testicular pain for 3 days.  Also had left knee pain for 1 to 2 days.  Scrotal ultrasound with Doppler was unremarkable.  Ammonia level was 110.  Alk phos 204, AST 124, ALT 74.  Total bili 5.6.  He was also found to have bilateral upper extremity nonpurulent cellulitis and started on ceftriaxone.  Subjective:  Does not appear to have confusion,  flapping tremor has almost resolved, reports 3bm last 24hrs, although only one documented Bilateral lower extremity edema Denies pain No fever Reports steady when getting up, he wants to have a shower  Assessment & Plan:   Principal Problem:   Alcoholic cirrhosis ([place]) Active Problems:   Hepatic encephalopathy ([facility])   Thrombocytopenia ([place])   Cellulitis   Homelessness   Acute hepatic encephalopathy -Improved on lactulose rifaximin  Decompensated liver cirrhosis/chronic thrombocytopenia Abdominal ultrasound showed cholelithiasis with gallbladder wall thickening could be seen in the setting of chronic liver disease versus acute cholecystitis Cirrhosis with patent TIPS Resume diuretics , bilateral lower extremity edema, venous doppler no DVT Patient denies abdominal pain, GI signed off  Bilateral upper extremity nonpurulent cellulitis Improved on antibiotic, continue  Depression Psychiatry consulted, he does not meet inpatient psychiatric placement, psychiatry signed off  Alcohol abuse Alcohol level on presentation 89 Social worker consulted   Class III obesity The patient's BMI is: Body mass index is 40.61 kg/m.[name] (From  admission, onward)          Start     Ordered   02/18/21 0500  CBC  Daily,   R      02/17/21 0821   02/18/21 0500  Comprehensive metabolic panel  Daily,   R      02/17/21 0821            DVT prophylaxis: Place and maintain sequential compression device Start: 02/17/21 3474   Code Status: Full Family Communication: Patient Disposition:   Status is: Inpatient   Dispo: The patient is from: No permanent address for the last 34-month              Anticipated d/c is to: To be determined              Anticipated d/c date is: Medically stable to discharge, social worker to help with placement                Consultants:   Psychiatry  GI  Procedures:   None  Antimicrobials:   Anti-infectives (From admission, onward)   Start     Dose/Rate Route Frequency Ordered Stop   02/17/21 1400  rifaximin (XIFAXAN) tablet 550 mg        550 mg Oral 2 times daily 02/17/21 1245     02/17/21 0900  cefTRIAXone (ROCEPHIN) 2 g in sodium chloride 0.9 % 100 mL IVPB        2 g 200 mL/hr over 30 Minutes Intravenous Daily 02/17/21 0821            Objective: Vitals:   02/20/21 0506 02/20/21 1522 02/20/21 2029 02/21/21 0433  BP: 110/65 129/89 128/79 135/69  Pulse: 87  81 92 84  Resp: $Remo'18 20 18 18  'RElQo$ Temp: 98.3 F (36.8 C) 97.9 F (36.6 C) 98.3 F (36.8 C) 98 F (36.7 C)  TempSrc:  Oral    SpO2: 97% 99% 99% 95%  Weight:      Height:        Intake/Output Summary (Last 24 hours) at 02/21/2021 1248 Last data filed at 02/21/2021 1142 Gross per 24 hour  Intake 944 ml  Output 1300 ml  Net -356 ml   Filed Weights   02/16/21 2244  Weight: 124.7 kg    Examination:  General exam: calm, NAD Respiratory system: Clear to auscultation. Respiratory effort normal. Cardiovascular system: S1 & S2 heard, RRR. No JVD, no murmur, No pedal edema. Gastrointestinal system: Abdomen is nondistended, soft and nontender. Normal bowel sounds heard. Central nervous system: Alert and oriented. No  focal neurological deficits. Extremities: Bilateral lower extremity pitting edema Skin: Vitiligo skin changes Psychiatry: Judgement and insight appear normal. Mood & affect appropriate.     Data Reviewed: I have personally reviewed following labs and imaging studies  CBC: Recent Labs  Lab 02/17/21 0147 02/18/21 0538 02/19/21 0513 02/20/21 0522 02/21/21 0503  WBC 8.3 8.1 8.5 8.0 8.7  NEUTROABS 5.0  --   --   --   --   HGB 12.7* 12.8* 13.0 13.2 13.1  HCT 36.8* 37.3* 38.4* 39.5 38.1*  MCV 100.3* 102.5* 102.9* 103.4* 101.6*  PLT 64* 61* 60* 56* 59*    Basic Metabolic Panel: Recent Labs  Lab 02/17/21 0147 02/18/21 0538 02/19/21 0513 02/20/21 0522 02/21/21 0503  NA 138 134* 135 134* 133*  K 4.7 3.8 4.4 4.4 4.3  CL 107 104 106 104 103  CO2 $Re'26 25 27 27 26  'EZg$ GLUCOSE 135* 144* 142* 181* 189*  BUN $Re'12 8 6 6 7  'OjC$ CREATININE 0.95 0.79 0.66 0.68 0.59*  CALCIUM 8.6* 8.4* 8.5* 8.7* 8.5*    GFR: Estimated Creatinine Clearance: 131.4 mL/min (A) (by C-G formula based on SCr of 0.59 mg/dL (L)).  Liver Function Tests: Recent Labs  Lab 02/17/21 0147 02/18/21 0538 02/19/21 0513 02/20/21 0522 02/21/21 0503  AST 124* 127* 132* 104* 87*  ALT 74* 74* 77* 74* 66*  ALKPHOS 204* 239* 251* 280* 244*  BILITOT 5.6* 4.4* 4.5* 5.0* 5.4*  PROT 6.6 5.7* 6.1* 5.8* 5.6*  ALBUMIN 2.4* 2.1* 2.1* 2.1* 2.0*    CBG: No results for input(s): GLUCAP in the last 168 hours.   Recent Results (from the past 240 hour(s))  Resp Panel by RT-PCR (Flu A&B, Covid) Nasopharyngeal Swab     Status: None   Collection Time: 02/17/21  3:27 AM   Specimen: Nasopharyngeal Swab; Nasopharyngeal(NP) swabs in vial transport medium  Result Value Ref Range Status   SARS Coronavirus 2 by RT PCR NEGATIVE NEGATIVE Final    Comment: (NOTE) SARS-CoV-2 target nucleic acids are NOT DETECTED.  The SARS-CoV-2 RNA is generally detectable in upper respiratory specimens during the acute phase of infection. The  lowest concentration of SARS-CoV-2 viral copies this assay can detect is 138 copies/mL. A negative result does not preclude SARS-Cov-2 infection and should not be used as the sole basis for treatment or other patient management decisions. A negative result may occur with  improper specimen collection/handling, submission of specimen other than nasopharyngeal swab, presence of viral mutation(s) within the areas targeted by this assay, and inadequate number of viral copies(<138 copies/mL). A negative result must be combined with clinical observations, patient history, and epidemiological information. The  expected result is Negative.  Fact Sheet for Patients:  EntrepreneurPulse.com.au  Fact Sheet for Healthcare Providers:  IncredibleEmployment.be  This test is no t yet approved or cleared by the [place] FDA and  has been authorized for detection and/or diagnosis of SARS-CoV-2 by FDA under an Emergency Use Authorization (EUA). This EUA will remain  in effect (meaning this test can be used) for the duration of the COVID-19 declaration under Section 564(b)(1) of the Act, 21 U.S.C.section 360bbb-3(b)(1), unless the authorization is terminated  or revoked sooner.       Influenza A by PCR NEGATIVE NEGATIVE Final   Influenza B by PCR NEGATIVE NEGATIVE Final    Comment: (NOTE) The Xpert Xpress SARS-CoV-2/FLU/RSV plus assay is intended as an aid in the diagnosis of influenza from Nasopharyngeal swab specimens and should not be used as a sole basis for treatment. Nasal washings and aspirates are unacceptable for Xpert Xpress SARS-CoV-2/FLU/RSV testing.  Fact Sheet for Patients: EntrepreneurPulse.com.au  Fact Sheet for Healthcare Providers: IncredibleEmployment.be  This test is not yet approved or cleared by the [place] FDA and has been authorized for detection and/or diagnosis of SARS-CoV-2 by FDA under  an Emergency Use Authorization (EUA). This EUA will remain in effect (meaning this test can be used) for the duration of the COVID-19 declaration under Section 564(b)(1) of the Act, 21 U.S.C. section 360bbb-3(b)(1), unless the authorization is terminated or revoked.  Performed at [facility], [place] [address] [place], [place] [address]          Radiology Studies: VAS [place] LOWER EXTREMITY VENOUS (DVT)  Result Date: 02/20/2021  Lower Venous DVT Study Patient Name:  Angel Webster  Date of Exam:   02/20/2021 Medical Rec #: [id number]      Accession #:    [id number] Date of Birth: 18-Jan-1961     Patient Gender: M Patient Age:   59Y Exam Location:  [facility] Procedure:      VAS [place] LOWER EXTREMITY VENOUS (DVT) Referring Phys: [id number] Yahsir Wickens --------------------------------------------------------------------------------  Indications: Edema.  Risk Factors: None identified. Limitations: Body habitus and poor ultrasound/tissue interface. Comparison Study: No prior studies. Performing Technologist: Oliver Hum RVT  Examination Guidelines: A complete evaluation includes B-mode imaging, spectral Doppler, color Doppler, and power Doppler as needed of all accessible portions of each vessel. Bilateral testing is considered an integral part of a complete examination. Limited examinations for reoccurring indications may be performed as noted. The reflux portion of the exam is performed with the patient in reverse Trendelenburg.  +---------+---------------+---------+-----------+----------+--------------+ RIGHT    CompressibilityPhasicitySpontaneityPropertiesThrombus Aging +---------+---------------+---------+-----------+----------+--------------+ CFV      Full           Yes      Yes                                 +---------+---------------+---------+-----------+----------+--------------+ SFJ      Full                                                         +---------+---------------+---------+-----------+----------+--------------+ FV Prox  Full                                                        +---------+---------------+---------+-----------+----------+--------------+  FV Mid   Full                                                        +---------+---------------+---------+-----------+----------+--------------+ FV DistalFull                                                        +---------+---------------+---------+-----------+----------+--------------+ PFV      Full                                                        +---------+---------------+---------+-----------+----------+--------------+ POP      Full           Yes      Yes                                 +---------+---------------+---------+-----------+----------+--------------+ PTV      Full                                                        +---------+---------------+---------+-----------+----------+--------------+ PERO     Full                                                        +---------+---------------+---------+-----------+----------+--------------+   +---------+---------------+---------+-----------+----------+--------------+ LEFT     CompressibilityPhasicitySpontaneityPropertiesThrombus Aging +---------+---------------+---------+-----------+----------+--------------+ CFV      Full           Yes      Yes                                 +---------+---------------+---------+-----------+----------+--------------+ SFJ      Full                                                        +---------+---------------+---------+-----------+----------+--------------+ FV Prox  Full                                                        +---------+---------------+---------+-----------+----------+--------------+ FV Mid   Full                                                         +---------+---------------+---------+-----------+----------+--------------+  FV DistalFull                                                        +---------+---------------+---------+-----------+----------+--------------+ PFV      Full                                                        +---------+---------------+---------+-----------+----------+--------------+ POP      Full           Yes      Yes                                 +---------+---------------+---------+-----------+----------+--------------+ PTV      Full                                                        +---------+---------------+---------+-----------+----------+--------------+ PERO     Full                                                        +---------+---------------+---------+-----------+----------+--------------+     Summary: RIGHT: - There is no evidence of deep vein thrombosis in the lower extremity. However, portions of this examination were limited- see technologist comments above.  - No cystic structure found in the popliteal fossa.  LEFT: - There is no evidence of deep vein thrombosis in the lower extremity. However, portions of this examination were limited- see technologist comments above.  - No cystic structure found in the popliteal fossa.  *See table(s) above for measurements and observations. Electronically signed by Harold Barban MD on 02/20/2021 at 2:01:12 PM.    Final         Scheduled Meds: . folic acid  1 mg Oral Daily  . furosemide  40 mg Oral BID  . lactulose  30 g Oral TID  . multivitamin with minerals  1 tablet Oral Daily  . rifaximin  550 mg Oral BID  . sodium chloride flush  3 mL Intravenous Q12H  . spironolactone  100 mg Oral Daily  . thiamine  100 mg Oral Daily   Or  . thiamine  100 mg Intravenous Daily   Continuous Infusions: . cefTRIAXone (ROCEPHIN)  IV 2 g (02/21/21 0833)     LOS: 4 days   Time spent: 91mins Greater than 50% of this time was spent in  counseling, explanation of diagnosis, planning of further management, and coordination of care.   Voice Recognition Viviann Spare dictation system was used to create this note, attempts have been made to correct errors. Please contact the author with questions and/or clarifications.   Florencia Reasons, MD PhD FACP [facility]  Available via Epic secure chat 7am-7pm for nonurgent issues Please page for urgent issues To page the attending provider between 7A-7P or the covering  provider during after hours 7P-7A, please log into the web site [email or web address] and access using universal [place] password for that web site. If you do not have the password, please call the hospital operator.    02/21/2021, 12:48 PM

## 2021-02-22 DIAGNOSIS — K7290 Hepatic failure, unspecified without coma: Secondary | ICD-10-CM | POA: Diagnosis not present

## 2021-02-22 DIAGNOSIS — Z5900 Homelessness unspecified: Secondary | ICD-10-CM

## 2021-02-22 DIAGNOSIS — D696 Thrombocytopenia, unspecified: Secondary | ICD-10-CM | POA: Diagnosis not present

## 2021-02-22 DIAGNOSIS — K7030 Alcoholic cirrhosis of liver without ascites: Secondary | ICD-10-CM | POA: Diagnosis not present

## 2021-02-22 LAB — COMPREHENSIVE METABOLIC PANEL
ALT: 67 U/L — ABNORMAL HIGH (ref 0–44)
AST: 94 U/L — ABNORMAL HIGH (ref 15–41)
Albumin: 2.2 g/dL — ABNORMAL LOW (ref 3.5–5.0)
Alkaline Phosphatase: 236 U/L — ABNORMAL HIGH (ref 38–126)
Anion gap: 5 (ref 5–15)
BUN: 8 mg/dL (ref 6–20)
CO2: 27 mmol/L (ref 22–32)
Calcium: 8.8 mg/dL — ABNORMAL LOW (ref 8.9–10.3)
Chloride: 101 mmol/L (ref 98–111)
Creatinine, Ser: 0.65 mg/dL (ref 0.61–1.24)
GFR, Estimated: >60 mL/min (ref >60)
Glucose, Bld: 112 mg/dL — ABNORMAL HIGH (ref 70–99)
Potassium: 4.1 mmol/L (ref 3.5–5.1)
Sodium: 133 mmol/L — ABNORMAL LOW (ref 135–145)
Total Bilirubin: 6.2 mg/dL — ABNORMAL HIGH (ref 0.3–1.2)
Total Protein: 6.2 g/dL — ABNORMAL LOW (ref 6.5–8.1)

## 2021-02-22 LAB — CBC
HCT: 40.3 % (ref 39.0–52.0)
Hemoglobin: 14 g/dL (ref 13.0–17.0)
MCH: 34.9 pg — ABNORMAL HIGH (ref 26.0–34.0)
MCHC: 34.7 g/dL (ref 30.0–36.0)
MCV: 100.5 fL — ABNORMAL HIGH (ref 80.0–100.0)
Platelets: 64 10*3/uL — ABNORMAL LOW (ref 150–400)
RBC: 4.01 MIL/uL — ABNORMAL LOW (ref 4.22–5.81)
RDW: 16.1 % — ABNORMAL HIGH (ref 11.5–15.5)
WBC: 8.6 10*3/uL (ref 4.0–10.5)
nRBC: 0 % (ref 0.0–0.2)

## 2021-02-22 NOTE — Progress Notes (Signed)
PROGRESS NOTE    Angel Webster  GLO:[id number] DOB: 22-Mar-1961 DOA: 02/16/2021 PCP: Angel Perna, NP    Chief Complaint  Patient presents with  . Testicle Pain  . Knee Pain    Brief Narrative:  60 year old male with history of homelessness, hypertension, asthma, chronic thrombocytopenia, liver cirrhosis, alcohol abuse who was brought by EMS from [employer] with right testicular pain for 3 days.  Also had left knee pain for 1 to 2 days.  Scrotal ultrasound with Doppler was unremarkable.  Ammonia level was 110.  Alk phos 204, AST 124, ALT 74.  Total bili 5.6.  He was also found to have bilateral upper extremity nonpurulent cellulitis and started on ceftriaxone.  Subjective:  Walked with a walker in the hallway this morning, no confusion,  Persistent bilateral lower extremity edema Denies pain No fever   Assessment & Plan:   Principal Problem:   Alcoholic cirrhosis ([facility]) Active Problems:   Hepatic encephalopathy ([facility])   Thrombocytopenia ([facility])   Cellulitis   Homelessness   Acute hepatic encephalopathy -Improved on lactulose rifaximin  Decompensated liver cirrhosis/chronic thrombocytopenia Abdominal ultrasound showed cholelithiasis with gallbladder wall thickening could be seen in the setting of chronic liver disease versus acute cholecystitis Cirrhosis with patent TIPS Resume diuretics , bilateral lower extremity edema, venous doppler no DVT Patient denies abdominal pain, GI signed off  Bilateral upper extremity nonpurulent cellulitis Improved on antibiotic, plan to finish total 7 days of antibiotic treatment, last dose on 5/28  Depression Psychiatry consulted, he does not meet inpatient psychiatric placement, psychiatry signed off  Alcohol abuse/homeless Alcohol level on presentation 27 Social worker consulted   Class III obesity The patient's BMI is: Body mass index is 40.61 kg/m.Angel Webster Labs (From admission, onward)         None         DVT prophylaxis: Place and maintain sequential compression device Start: 02/17/21 1884   Code Status: Full Family Communication: Patient Disposition:   Status is: Inpatient   Dispo: The patient is from: No permanent address for the last 66-month              Anticipated d/c is to: To be determined              Anticipated d/c date is: Medically stable to discharge, social worker to help with placement                Consultants:   Psychiatry  GI  Procedures:   None  Antimicrobials:   Anti-infectives (From admission, onward)   Start     Dose/Rate Route Frequency Ordered Stop   02/17/21 1400  rifaximin (XIFAXAN) tablet 550 mg        550 mg Oral 2 times daily 02/17/21 1245     02/17/21 0900  cefTRIAXone (ROCEPHIN) 2 g in sodium chloride 0.9 % 100 mL IVPB        2 g 200 mL/hr over 30 Minutes Intravenous Daily 02/17/21 0821 02/24/21 0959          Objective: Vitals:   02/21/21 1543 02/21/21 2036 02/22/21 0511 02/22/21 1319  BP: 114/75 100/85 137/77 114/90  Pulse: 77 89 66 91  Resp: $Remo'16 18 16 20  'Nipep$ Temp: (!) 97.5 F (36.4 C) 98.6 F (37 C) 98 F (36.7 C) 98.1 F (36.7 C)  TempSrc: Oral Oral Oral   SpO2: 97% 98% 96% 100%  Weight:      Height:  Intake/Output Summary (Last 24 hours) at 02/22/2021 1411 Last data filed at 02/22/2021 0511 Gross per 24 hour  Intake 593 ml  Output 1375 ml  Net -782 ml   Filed Weights   02/16/21 2244  Weight: 124.7 kg    Examination:  General exam: calm, NAD Respiratory system: Clear to auscultation. Respiratory effort normal. Cardiovascular system: S1 & S2 heard, RRR. No JVD, no murmur, No pedal edema. Gastrointestinal system: Abdomen is nondistended, soft and nontender. Normal bowel sounds heard. Central nervous system: Alert and oriented. No focal neurological deficits. Extremities: Bilateral lower extremity pitting edema Skin: Vitiligo skin changes Psychiatry: Judgement and insight appear normal. Mood &  affect appropriate.     Data Reviewed: I have personally reviewed following labs and imaging studies  CBC: Recent Labs  Lab 02/17/21 0147 02/18/21 0538 02/19/21 0513 02/20/21 0522 02/21/21 0503 02/22/21 0547  WBC 8.3 8.1 8.5 8.0 8.7 8.6  NEUTROABS 5.0  --   --   --   --   --   HGB 12.7* 12.8* 13.0 13.2 13.1 14.0  HCT 36.8* 37.3* 38.4* 39.5 38.1* 40.3  MCV 100.3* 102.5* 102.9* 103.4* 101.6* 100.5*  PLT 64* 61* 60* 56* 59* 64*    Basic Metabolic Panel: Recent Labs  Lab 02/18/21 0538 02/19/21 0513 02/20/21 0522 02/21/21 0503 02/22/21 0547  NA 134* 135 134* 133* 133*  K 3.8 4.4 4.4 4.3 4.1  CL 104 106 104 103 101  CO2 $Re'25 27 27 26 27  'WyJ$ GLUCOSE 144* 142* 181* 189* 112*  BUN $Re'8 6 6 7 8  'XJO$ CREATININE 0.79 0.66 0.68 0.59* 0.65  CALCIUM 8.4* 8.5* 8.7* 8.5* 8.8*    GFR: Estimated Creatinine Clearance: 131.4 mL/min (by C-G formula based on SCr of 0.65 mg/dL).  Liver Function Tests: Recent Labs  Lab 02/18/21 0538 02/19/21 0513 02/20/21 0522 02/21/21 0503 02/22/21 0547  AST 127* 132* 104* 87* 94*  ALT 74* 77* 74* 66* 67*  ALKPHOS 239* 251* 280* 244* 236*  BILITOT 4.4* 4.5* 5.0* 5.4* 6.2*  PROT 5.7* 6.1* 5.8* 5.6* 6.2*  ALBUMIN 2.1* 2.1* 2.1* 2.0* 2.2*    CBG: No results for input(s): GLUCAP in the last 168 hours.   Recent Results (from the past 240 hour(s))  Resp Panel by RT-PCR (Flu A&B, Covid) Nasopharyngeal Swab     Status: None   Collection Time: 02/17/21  3:27 AM   Specimen: Nasopharyngeal Swab; Nasopharyngeal(NP) swabs in vial transport medium  Result Value Ref Range Status   SARS Coronavirus 2 by RT PCR NEGATIVE NEGATIVE Final    Comment: (NOTE) SARS-CoV-2 target nucleic acids are NOT DETECTED.  The SARS-CoV-2 RNA is generally detectable in upper respiratory specimens during the acute phase of infection. The lowest concentration of SARS-CoV-2 viral copies this assay can detect is 138 copies/mL. A negative result does not preclude SARS-Cov-2 infection  and should not be used as the sole basis for treatment or other patient management decisions. A negative result may occur with  improper specimen collection/handling, submission of specimen other than nasopharyngeal swab, presence of viral mutation(s) within the areas targeted by this assay, and inadequate number of viral copies(<138 copies/mL). A negative result must be combined with clinical observations, patient history, and epidemiological information. The expected result is Negative.  Fact Sheet for Patients:  EntrepreneurPulse.com.au  Fact Sheet for Healthcare Providers:  IncredibleEmployment.be  This test is no t yet approved or cleared by the [place] FDA and  has been authorized for detection and/or diagnosis of SARS-CoV-2 by  FDA under an Emergency Use Authorization (EUA). This EUA will remain  in effect (meaning this test can be used) for the duration of the COVID-19 declaration under Section 564(b)(1) of the Act, 21 U.S.C.section 360bbb-3(b)(1), unless the authorization is terminated  or revoked sooner.       Influenza A by PCR NEGATIVE NEGATIVE Final   Influenza B by PCR NEGATIVE NEGATIVE Final    Comment: (NOTE) The Xpert Xpress SARS-CoV-2/FLU/RSV plus assay is intended as an aid in the diagnosis of influenza from Nasopharyngeal swab specimens and should not be used as a sole basis for treatment. Nasal washings and aspirates are unacceptable for Xpert Xpress SARS-CoV-2/FLU/RSV testing.  Fact Sheet for Patients: EntrepreneurPulse.com.au  Fact Sheet for Healthcare Providers: IncredibleEmployment.be  This test is not yet approved or cleared by the [place] FDA and has been authorized for detection and/or diagnosis of SARS-CoV-2 by FDA under an Emergency Use Authorization (EUA). This EUA will remain in effect (meaning this test can be used) for the duration of the COVID-19 declaration  under Section 564(b)(1) of the Act, 21 U.S.C. section 360bbb-3(b)(1), unless the authorization is terminated or revoked.  Performed at [facility], [place] [address] [place],  [address]          Radiology Studies: No results found.      Scheduled Meds: . folic acid  1 mg Oral Daily  . furosemide  40 mg Oral BID  . lactulose  30 g Oral TID  . multivitamin with minerals  1 tablet Oral Daily  . rifaximin  550 mg Oral BID  . sodium chloride flush  3 mL Intravenous Q12H  . spironolactone  100 mg Oral Daily  . thiamine  100 mg Oral Daily   Or  . thiamine  100 mg Intravenous Daily   Continuous Infusions: . cefTRIAXone (ROCEPHIN)  IV 2 g (02/22/21 1039)     LOS: 5 days   Time spent: 49mins Greater than 50% of this time was spent in counseling, explanation of diagnosis, planning of further management, and coordination of care.   Voice Recognition Viviann Spare dictation system was used to create this note, attempts have been made to correct errors. Please contact the author with questions and/or clarifications.   Florencia Reasons, MD PhD FACP [facility]  Available via Epic secure chat 7am-7pm for nonurgent issues Please page for urgent issues To page the attending provider between 7A-7P or the covering provider during after hours 7P-7A, please log into the web site [email or web address] and access using universal [place] password for that web site. If you do not have the password, please call the hospital operator.    02/22/2021, 2:11 PM

## 2021-02-23 DIAGNOSIS — Z5900 Homelessness unspecified: Secondary | ICD-10-CM | POA: Diagnosis not present

## 2021-02-23 DIAGNOSIS — D696 Thrombocytopenia, unspecified: Secondary | ICD-10-CM | POA: Diagnosis not present

## 2021-02-23 DIAGNOSIS — K7290 Hepatic failure, unspecified without coma: Secondary | ICD-10-CM | POA: Diagnosis not present

## 2021-02-23 DIAGNOSIS — K7030 Alcoholic cirrhosis of liver without ascites: Secondary | ICD-10-CM | POA: Diagnosis not present

## 2021-02-23 MED ORDER — ALUM & MAG HYDROXIDE-SIMETH 200-200-20 MG/5ML PO SUSP
30.0000 mL | Freq: Four times a day (QID) | ORAL | Status: DC | PRN
  Administered 2021-02-23 – 2021-02-27 (×4): 30 mL via ORAL
  Filled 2021-02-23 (×4): qty 30

## 2021-02-23 NOTE — Progress Notes (Signed)
PROGRESS NOTE    Angel Webster  NWG:[id number] DOB: [birth year] DOA: 02/16/2021 PCP: Kerin Perna, NP    Chief Complaint  Patient presents with  . Testicle Pain  . Knee Pain    Brief Narrative:  60 year old male with history of homelessness, hypertension, asthma, chronic thrombocytopenia, liver cirrhosis, alcohol abuse who was brought by EMS from [employer] with right testicular pain for 3 days.  Also had left knee pain for 1 to 2 days.  Scrotal ultrasound with Doppler was unremarkable.  Ammonia level was 110.  Alk phos 204, AST 124, ALT 74.  Total bili 5.6.  He was also found to have bilateral upper extremity nonpurulent cellulitis and started on ceftriaxone.  Subjective:  no confusion,   bilateral lower extremity edema start to improve Denies pain No fever   Assessment & Plan:   Principal Problem:   Alcoholic cirrhosis (HCC) Active Problems:   Hepatic encephalopathy (HCC)   Thrombocytopenia (HCC)   Cellulitis   Homelessness   Acute hepatic encephalopathy -Improved on lactulose rifaximin  Decompensated liver cirrhosis/chronic thrombocytopenia Abdominal ultrasound showed cholelithiasis with gallbladder wall thickening could be seen in the setting of chronic liver disease versus acute cholecystitis Cirrhosis with patent TIPS Resume diuretics , bilateral lower extremity edema improved, venous doppler no DVT Patient denies abdominal pain, GI signed off  Bilateral upper extremity nonpurulent cellulitis Improved on antibiotic, plan to finish total 7 days of antibiotic treatment, last dose on 5/28 improved  Depression Psychiatry consulted, he does not meet inpatient psychiatric placement, psychiatry signed off  Alcohol abuse/homeless Alcohol level on presentation 70 Social worker consulted   Class III obesity The patient's BMI is: Body mass index is 40.61 kg/m.Angel Webster Labs (From admission, onward)         None        DVT  prophylaxis: Place and maintain sequential compression device Start: 02/17/21 5784   Code Status: Full Family Communication: Patient Disposition:   Status is: Inpatient   Dispo: The patient is from: No permanent address for the last 68-month              Anticipated d/c is to: To be determined              Anticipated d/c date is: Medically stable to discharge, social worker to help with placement                Consultants:   Psychiatry  GI  Procedures:   None  Antimicrobials:   Anti-infectives (From admission, onward)   Start     Dose/Rate Route Frequency Ordered Stop   02/17/21 1400  rifaximin (XIFAXAN) tablet 550 mg        550 mg Oral 2 times daily 02/17/21 1245     02/17/21 0900  cefTRIAXone (ROCEPHIN) 2 g in sodium chloride 0.9 % 100 mL IVPB        2 g 200 mL/hr over 30 Minutes Intravenous Daily 02/17/21 0821 02/23/21 1009          Objective: Vitals:   02/22/21 1319 02/22/21 2136 02/23/21 0450 02/23/21 1330  BP: 114/90 108/70 111/75 133/86  Pulse: 91 85 83 77  Resp: $Remo'20 20 20 18  'lsMsq$ Temp: 98.1 F (36.7 C) 98.2 F (36.8 C) (!) 97.4 F (36.3 C) 97.6 F (36.4 C)  TempSrc:  Oral Oral Oral  SpO2: 100% 93% 96% 99%  Weight:      Height:        Intake/Output Summary (  Last 24 hours) at 02/23/2021 1333 Last data filed at 02/23/2021 1115 Gross per 24 hour  Intake --  Output 2275 ml  Net -2275 ml   Filed Weights   02/16/21 2244  Weight: 124.7 kg    Examination:  General exam: calm, NAD Respiratory system: Clear to auscultation. Respiratory effort normal. Cardiovascular system: S1 & S2 heard, RRR. No JVD, no murmur, No pedal edema. Gastrointestinal system: Abdomen is nondistended, soft and nontender. Normal bowel sounds heard. Central nervous system: Alert and oriented. No focal neurological deficits. Extremities: Bilateral lower extremity pitting edema Skin: Vitiligo skin changes Psychiatry: Judgement and insight appear normal. Mood & affect  appropriate.     Data Reviewed: I have personally reviewed following labs and imaging studies  CBC: Recent Labs  Lab 02/17/21 0147 02/18/21 0538 02/19/21 0513 02/20/21 0522 02/21/21 0503 02/22/21 0547  WBC 8.3 8.1 8.5 8.0 8.7 8.6  NEUTROABS 5.0  --   --   --   --   --   HGB 12.7* 12.8* 13.0 13.2 13.1 14.0  HCT 36.8* 37.3* 38.4* 39.5 38.1* 40.3  MCV 100.3* 102.5* 102.9* 103.4* 101.6* 100.5*  PLT 64* 61* 60* 56* 59* 64*    Basic Metabolic Panel: Recent Labs  Lab 02/18/21 0538 02/19/21 0513 02/20/21 0522 02/21/21 0503 02/22/21 0547  NA 134* 135 134* 133* 133*  K 3.8 4.4 4.4 4.3 4.1  CL 104 106 104 103 101  CO2 $Re'25 27 27 26 27  'nYR$ GLUCOSE 144* 142* 181* 189* 112*  BUN $Re'8 6 6 7 8  'Pqi$ CREATININE 0.79 0.66 0.68 0.59* 0.65  CALCIUM 8.4* 8.5* 8.7* 8.5* 8.8*    GFR: Estimated Creatinine Clearance: 131.4 mL/min (by C-G formula based on SCr of 0.65 mg/dL).  Liver Function Tests: Recent Labs  Lab 02/18/21 0538 02/19/21 0513 02/20/21 0522 02/21/21 0503 02/22/21 0547  AST 127* 132* 104* 87* 94*  ALT 74* 77* 74* 66* 67*  ALKPHOS 239* 251* 280* 244* 236*  BILITOT 4.4* 4.5* 5.0* 5.4* 6.2*  PROT 5.7* 6.1* 5.8* 5.6* 6.2*  ALBUMIN 2.1* 2.1* 2.1* 2.0* 2.2*    CBG: No results for input(s): GLUCAP in the last 168 hours.   Recent Results (from the past 240 hour(s))  Resp Panel by RT-PCR (Flu A&B, Covid) Nasopharyngeal Swab     Status: None   Collection Time: 02/17/21  3:27 AM   Specimen: Nasopharyngeal Swab; Nasopharyngeal(NP) swabs in vial transport medium  Result Value Ref Range Status   SARS Coronavirus 2 by RT PCR NEGATIVE NEGATIVE Final    Comment: (NOTE) SARS-CoV-2 target nucleic acids are NOT DETECTED.  The SARS-CoV-2 RNA is generally detectable in upper respiratory specimens during the acute phase of infection. The lowest concentration of SARS-CoV-2 viral copies this assay can detect is 138 copies/mL. A negative result does not preclude SARS-Cov-2 infection and  should not be used as the sole basis for treatment or other patient management decisions. A negative result may occur with  improper specimen collection/handling, submission of specimen other than nasopharyngeal swab, presence of viral mutation(s) within the areas targeted by this assay, and inadequate number of viral copies(<138 copies/mL). A negative result must be combined with clinical observations, patient history, and epidemiological information. The expected result is Negative.  Fact Sheet for Patients:  EntrepreneurPulse.com.au  Fact Sheet for Healthcare Providers:  IncredibleEmployment.be  This test is no t yet approved or cleared by the [place] FDA and  has been authorized for detection and/or diagnosis of SARS-CoV-2 by FDA under an  Emergency Use Authorization (EUA). This EUA will remain  in effect (meaning this test can be used) for the duration of the COVID-19 declaration under Section 564(b)(1) of the Act, 21 U.S.C.section 360bbb-3(b)(1), unless the authorization is terminated  or revoked sooner.       Influenza A by PCR NEGATIVE NEGATIVE Final   Influenza B by PCR NEGATIVE NEGATIVE Final    Comment: (NOTE) The Xpert Xpress SARS-CoV-2/FLU/RSV plus assay is intended as an aid in the diagnosis of influenza from Nasopharyngeal swab specimens and should not be used as a sole basis for treatment. Nasal washings and aspirates are unacceptable for Xpert Xpress SARS-CoV-2/FLU/RSV testing.  Fact Sheet for Patients: EntrepreneurPulse.com.au  Fact Sheet for Healthcare Providers: IncredibleEmployment.be  This test is not yet approved or cleared by the [place] FDA and has been authorized for detection and/or diagnosis of SARS-CoV-2 by FDA under an Emergency Use Authorization (EUA). This EUA will remain in effect (meaning this test can be used) for the duration of the COVID-19 declaration  under Section 564(b)(1) of the Act, 21 U.S.C. section 360bbb-3(b)(1), unless the authorization is terminated or revoked.  Performed at [facility], [place] [address] [place], [place] [address]          Radiology Studies: No results found.      Scheduled Meds: . folic acid  1 mg Oral Daily  . furosemide  40 mg Oral BID  . lactulose  30 g Oral TID  . multivitamin with minerals  1 tablet Oral Daily  . rifaximin  550 mg Oral BID  . sodium chloride flush  3 mL Intravenous Q12H  . spironolactone  100 mg Oral Daily  . thiamine  100 mg Oral Daily   Or  . thiamine  100 mg Intravenous Daily   Continuous Infusions:    LOS: 6 days   Time spent: 19mins Greater than 50% of this time was spent in counseling, explanation of diagnosis, planning of further management, and coordination of care.   Voice Recognition Viviann Spare dictation system was used to create this note, attempts have been made to correct errors. Please contact the author with questions and/or clarifications.   Florencia Reasons, MD PhD FACP [facility]  Available via Epic secure chat 7am-7pm for nonurgent issues Please page for urgent issues To page the attending provider between 7A-7P or the covering provider during after hours 7P-7A, please log into the web site [email or web address] and access using universal [place] password for that web site. If you do not have the password, please call the hospital operator.    02/23/2021, 1:33 PM

## 2021-02-24 DIAGNOSIS — K7290 Hepatic failure, unspecified without coma: Secondary | ICD-10-CM | POA: Diagnosis not present

## 2021-02-24 DIAGNOSIS — Z5900 Homelessness unspecified: Secondary | ICD-10-CM | POA: Diagnosis not present

## 2021-02-24 DIAGNOSIS — D696 Thrombocytopenia, unspecified: Secondary | ICD-10-CM | POA: Diagnosis not present

## 2021-02-24 DIAGNOSIS — K7030 Alcoholic cirrhosis of liver without ascites: Secondary | ICD-10-CM | POA: Diagnosis not present

## 2021-02-24 MED ORDER — XIFAXAN 550 MG PO TABS: 550.0000 mg | ORAL_TABLET | Freq: Two times a day (BID) | ORAL | 0 refills | Status: DC

## 2021-02-24 NOTE — Progress Notes (Addendum)
PROGRESS NOTE    Angel Webster  JQB:[id number] DOB: Sep 15, 1961 DOA: 02/16/2021 PCP: Kerin Perna, NP    Chief Complaint  Patient presents with  . Testicle Pain  . Knee Pain    Brief Narrative:  60 year old male with history of homelessness, hypertension, asthma, chronic thrombocytopenia, liver cirrhosis, alcohol abuse who was brought by EMS from [employer] with right testicular pain for 3 days.  Also had left knee pain for 1 to 2 days.  Scrotal ultrasound with Doppler was unremarkable.  Ammonia level was 110.  Alk phos 204, AST 124, ALT 74.  Total bili 5.6.  He was also found to have bilateral upper extremity nonpurulent cellulitis and started on ceftriaxone.  Subjective: No interval changes, bilateral lower extremity edema continue  to improve no confusion,   Denies pain No fever Medically stable to discharge, awaiting for safe discharge plan Sounds like he has a solid plan in place to discharge to hotel tomorrow on 5/30   Assessment & Plan:   Principal Problem:   Alcoholic cirrhosis ([place]) Active Problems:   Hepatic encephalopathy ([place])   Thrombocytopenia ([place])   Cellulitis   Homelessness   Acute hepatic encephalopathy -Improved on lactulose rifaximin  Decompensated liver cirrhosis/chronic thrombocytopenia Abdominal ultrasound showed cholelithiasis with gallbladder wall thickening could be seen in the setting of chronic liver disease versus acute cholecystitis Cirrhosis with patent TIPS Resume diuretics , bilateral lower extremity edema improved, venous doppler no DVT Patient denies abdominal pain, GI signed off  Bilateral upper extremity nonpurulent cellulitis Improved on antibiotic, plan to finish total 7 days of antibiotic treatment, last dose on 5/28 improved  Depression Psychiatry consulted, he does not meet inpatient psychiatric placement, psychiatry signed off  Alcohol abuse/homeless Alcohol level on presentation 36 Social worker consulted,  follow up with alcohol treatment facility [facility] in [place]    Class III obesity The patient's BMI is: Body mass index is 40.61 kg/m.[name] Labs (From admission, onward)         None        DVT prophylaxis: Place and maintain sequential compression device Start: 02/17/21 4097   Code Status: Full Family Communication: Patient Disposition:   Status is: Inpatient   Dispo: The patient is from: No permanent address for the last 92-month             Anticipated d/c is to: to hotel on 5/30              Anticipated d/c date is: Medically stable to discharge, social worker to help with placement, sounds like secured a hotel tomorrow on 5/30                Consultants:   Psychiatry  GI  Procedures:   None  Antimicrobials:   Anti-infectives (From admission, onward)   Start     Dose/Rate Route Frequency Ordered Stop   02/24/21 0000  XIFAXAN 550 MG TABS tablet        550 mg Oral 2 times daily 02/24/21 1159 03/26/21 2359   02/17/21 1400  rifaximin (XIFAXAN) tablet 550 mg        550 mg Oral 2 times daily 02/17/21 1245     02/17/21 0900  cefTRIAXone (ROCEPHIN) 2 g in sodium chloride 0.9 % 100 mL IVPB        2 g 200 mL/hr over 30 Minutes Intravenous Daily 02/17/21 0821 02/23/21 1009          Objective: Vitals:  02/23/21 0450 02/23/21 1330 02/23/21 2050 02/24/21 0545  BP: 111/75 133/86 114/74 105/68  Pulse: 83 77 79 76  Resp: _0 Temp: (!) 97.4 F (36.3 C) 97.6 F (36.4 C) 98.3 F (36.8 C) 98.5 F (36.9 C)  TempSrc: Oral Oral Oral   SpO2: 96% 99% 97% 95%  Weight:      Height:        Intake/Output Summary (Last 24 hours) at 02/24/2021 1204 Last data filed at 02/24/2021 0545 Gross per 24 hour  Intake 1066 ml  Output 1475 ml  Net -409 ml   Filed Weights   02/16/21 2244  Weight: 124.7 kg    Examination:  General exam: calm, NAD Respiratory system: Clear to auscultation. Respiratory effort normal. Cardiovascular system: S1 &  S2 heard, RRR. No JVD, no murmur, No pedal edema. Gastrointestinal system: Abdomen is nondistended, soft and nontender. Normal bowel sounds heard. Central nervous system: Alert and oriented. No focal neurological deficits. Extremities: Bilateral lower extremity pitting edema improved Skin: Vitiligo skin changes Psychiatry: Judgement and insight appear normal. Mood & affect appropriate.     Data Reviewed: I have personally reviewed following labs and imaging studies  CBC: Recent Labs  Lab 02/18/21 0538 02/19/21 0513 02/20/21 0522 02/21/21 0503 02/22/21 0547  WBC 8.1 8.5 8.0 8.7 8.6  HGB 12.8* 13.0 13.2 13.1 14.0  HCT 37.3* 38.4* 39.5 38.1* 40.3  MCV 102.5* 102.9* 103.4* 101.6* 100.5*  PLT 61* 60* 56* 59* 64*    Basic Metabolic Panel: Recent Labs  Lab 02/18/21 0538 02/19/21 0513 02/20/21 0522 02/21/21 0503 02/22/21 0547  NA 134* 135 134* 133* 133*  K 3.8 4.4 4.4 4.3 4.1  CL 104 106 104 103 101  CO2 _1 GLUCOSE 144* 142* 181* 189* 112*  BUN _2 CREATININE 0.79 0.66 0.68 0.59* 0.65  CALCIUM 8.4* 8.5* 8.7* 8.5* 8.8*    GFR: Estimated Creatinine Clearance: 131.4 mL/min (by C-G formula based on SCr of 0.65 mg/dL).  Liver Function Tests: Recent Labs  Lab 02/18/21 0538 02/19/21 0513 02/20/21 0522 02/21/21 0503 02/22/21 0547  AST 127* 132* 104* 87* 94*  ALT 74* 77* 74* 66* 67*  ALKPHOS 239* 251* 280* 244* 236*  BILITOT 4.4* 4.5* 5.0* 5.4* 6.2*  PROT 5.7* 6.1* 5.8* 5.6* 6.2*  ALBUMIN 2.1* 2.1* 2.1* 2.0* 2.2*    CBG: No results for input(s): GLUCAP in the last 168 hours.   Recent Results (from the past 240 hour(s))  Resp Panel by RT-PCR (Flu A&B, Covid) Nasopharyngeal Swab     Status: None   Collection Time: 02/17/21  3:27 AM   Specimen: Nasopharyngeal Swab; Nasopharyngeal(NP) swabs in vial transport medium  Result Value Ref Range Status   SARS Coronavirus 2 by RT PCR NEGATIVE NEGATIVE Final    Comment: (NOTE) SARS-CoV-2 target nucleic  acids are NOT DETECTED.  The SARS-CoV-2 RNA is generally detectable in upper respiratory specimens during the acute phase of infection. The lowest concentration of SARS-CoV-2 viral copies this assay can detect is 138 copies/mL. A negative result does not preclude SARS-Cov-2 infection and should not be used as the sole basis for treatment or other patient management decisions. A negative result may occur with  improper specimen collection/handling, submission of specimen other than nasopharyngeal swab, presence of viral mutation(s) within the areas targeted by this assay, and inadequate number of viral copies(<138 copies/mL). A negative result must be combined with clinical observations, patient history, and epidemiological  information. The expected result is Negative.  Fact Sheet for Patients:  EntrepreneurPulse.com.au  Fact Sheet for Healthcare Providers:  IncredibleEmployment.be  This test is no t yet approved or cleared by the [place] FDA and  has been authorized for detection and/or diagnosis of SARS-CoV-2 by FDA under an Emergency Use Authorization (EUA). This EUA will remain  in effect (meaning this test can be used) for the duration of the COVID-19 declaration under Section 564(b)(1) of the Act, 21 U.S.C.section 360bbb-3(b)(1), unless the authorization is terminated  or revoked sooner.       Influenza A by PCR NEGATIVE NEGATIVE Final   Influenza B by PCR NEGATIVE NEGATIVE Final    Comment: (NOTE) The Xpert Xpress SARS-CoV-2/FLU/RSV plus assay is intended as an aid in the diagnosis of influenza from Nasopharyngeal swab specimens and should not be used as a sole basis for treatment. Nasal washings and aspirates are unacceptable for Xpert Xpress SARS-CoV-2/FLU/RSV testing.  Fact Sheet for Patients: EntrepreneurPulse.com.au  Fact Sheet for Healthcare Providers: IncredibleEmployment.be  This  test is not yet approved or cleared by the [place] FDA and has been authorized for detection and/or diagnosis of SARS-CoV-2 by FDA under an Emergency Use Authorization (EUA). This EUA will remain in effect (meaning this test can be used) for the duration of the COVID-19 declaration under Section 564(b)(1) of the Act, 21 U.S.C. section 360bbb-3(b)(1), unless the authorization is terminated or revoked.  Performed at [facility], [place] [address], [place], [place] [address]          Radiology Studies: No results found.      Scheduled Meds: . folic acid  1 mg Oral Daily  . furosemide  40 mg Oral BID  . lactulose  30 g Oral TID  . multivitamin with minerals  1 tablet Oral Daily  . rifaximin  550 mg Oral BID  . sodium chloride flush  3 mL Intravenous Q12H  . spironolactone  100 mg Oral Daily  . thiamine  100 mg Oral Daily   Or  . thiamine  100 mg Intravenous Daily   Continuous Infusions:    LOS: 7 days   Time spent: 73mns Greater than 50% of this time was spent in counseling, explanation of diagnosis, planning of further management, and coordination of care.   Voice Recognition /Viviann Sparedictation system was used to create this note, attempts have been made to correct errors. Please contact the author with questions and/or clarifications.   FFlorencia Reasons MD PhD FACP [facility]  Available via Epic secure chat 7am-7pm for nonurgent issues Please page for urgent issues To page the attending provider between 7A-7P or the covering provider during after hours 7P-7A, please log into the web site [email or web address] and access using universal [place] password for that web site. If you do not have the password, please call the hospital operator.    02/24/2021, 12:04 PM

## 2021-02-24 NOTE — TOC Progression Note (Signed)
Transition of Care [facility]) - Progression Note    Patient Details  Name: Angel Webster MRN: [id number] Date of Birth: [birth year]  Transition of Care [facility]) CM/SW Contact  Jondavid Schreier, Meriam Sprague, RN Phone Number: 02/24/2021, 1:07 PM  Clinical Narrative:    MD contacted this CM to see if pt could dc today as he has been medically ready for several days. Spoke with pt at bedside to discuss dc plan. Pt still does not have a solid plan for dc. Spoke with pt about his social security check and could he use some of it for a hotel room. Gave pt a list of low cost hotels in the area. He is going to call some to see if they will take him but wait on payment until he gets his check on June 1st.  Checked in with pt to see if he has had any luck with a hotel. Pt states that the [facility] on [address] downtown can take him tomorrow and they will work with him on payment using his upcoming social security check that he gets on the first. Pt states that he feels confident that this is a safe dc plan for him. MD made aware and will dc to hotel tomorrow. Pt plans to follow up with [facility] from hotel.   Expected Discharge Plan: Home/Self Care Barriers to Discharge: Active Substance Use - Placement  Expected Discharge Plan and Services Expected Discharge Plan: Home/Self Care In-house Referral: Clinical Social Work     Living arrangements for the past 2 months: No permanent address Expected Discharge Date: 02/24/21                   Readmission Risk Interventions No flowsheet data found.

## 2021-02-25 DIAGNOSIS — K7030 Alcoholic cirrhosis of liver without ascites: Secondary | ICD-10-CM | POA: Diagnosis not present

## 2021-02-25 MED ORDER — XIFAXAN 550 MG PO TABS: 550.0000 mg | ORAL_TABLET | Freq: Two times a day (BID) | ORAL | 0 refills | Status: AC

## 2021-02-25 MED ORDER — SPIRONOLACTONE 100 MG PO TABS: 100.0000 mg | ORAL_TABLET | Freq: Every day | ORAL | 0 refills | Status: AC

## 2021-02-25 MED ORDER — THIAMINE HCL 100 MG PO TABS: 100.0000 mg | ORAL_TABLET | Freq: Every day | ORAL | 0 refills | Status: AC

## 2021-02-25 MED ORDER — LACTULOSE 10 GM/15ML PO SOLN: 30.0000 g | Freq: Three times a day (TID) | ORAL | 1 refills | Status: DC

## 2021-02-25 MED ORDER — FOLIC ACID 1 MG PO TABS: 1.0000 mg | ORAL_TABLET | Freq: Every day | ORAL | 0 refills | Status: AC

## 2021-02-25 MED ORDER — FUROSEMIDE 40 MG PO TABS: 40.0000 mg | ORAL_TABLET | Freq: Two times a day (BID) | ORAL | 0 refills | Status: AC

## 2021-02-25 NOTE — TOC Progression Note (Signed)
Transition of Care [facility]) - Progression Note    Patient Details  Name: Gopal Malter MRN: [id number] Date of Birth: 08-Apr-1961  Transition of Care [facility]) CM/SW [place], [place] Phone Number: 02/25/2021, 1:04 PM  Clinical Narrative:   Met with patient after being alerted by nursing that his plans had fallen apart.  Mr Fernandez and I called hotel together and they confirmed that he cannot get a room unless he has cash to pay.  His understanding from call yesterday was that he could pay them retroactively once he gets his check.  He gets his check on Wednesday and will plan to enter hotel then.  In the meantime, I sent updated clinicals to [place] for review. TOC will continue to follow during the course of hospitalization.     Expected Discharge Plan: Home/Self Care Barriers to Discharge: Active Substance Use - Placement  Expected Discharge Plan and Services Expected Discharge Plan: Home/Self Care In-house Referral: Clinical Social Work     Living arrangements for the past 2 months: No permanent address Expected Discharge Date: 02/25/21                                     Social Determinants of Health (SDOH) Interventions    Readmission Risk Interventions No flowsheet data found.

## 2021-02-25 NOTE — Discharge Summary (Addendum)
Physician Discharge Summary  Angel Webster ZSM:[id number] DOB: [birth year] DOA: 02/16/2021  PCP: Kerin Perna, NP  Admit date: 02/16/2021 Discharge date: 02/27/21  Admitted From: Home Disposition:  Home  Discharge Condition:Stable CODE STATUS:FULL Diet recommendation: Heart Healthy   Brief/Interim Summary:  Patient is a 60 year old male with history of homelessness, hypertension, asthma, chronic thrombocytopenia, liver cirrhosis, alcohol abuse who was brought by EMS from [employer] with right testicular pain for 3 days.  Also had left knee pain for 1 to 2 days.  Scrotal ultrasound with Doppler was unremarkable.  Ammonia level was 110.  Alk phos 204, AST 124, ALT 74.  Total bili 5.6.  He was also found to have bilateral upper extremity nonpurulent cellulitis and treated with abx.  Patient was treated for acute hepatic encephalopathy, resolved with lactulose/rifaximin.  GI consulted for suspicion.  Hospital course was prolonged due to alcohol abuse issues/Homelessness.  TOC was following.  He is medically stable for discharge today.  He will be discharged to hotel.  Plan is to follow-up with alcohol rehabilitation services on outpatient.  Following problems were addressed during his hospitalization:   Acute hepatic encephalopathy -Improved wiht lactulose/ rifaximin   Decompensated liver cirrhosis/chronic thrombocytopenia Abdominal ultrasound showed cholelithiasis with gallbladder wall thickening could be seen in the setting of chronic liver disease versus acute cholecystitis Cirrhosis with patent TIPS Resumed diuretics , bilateral lower extremity edema improved, venous doppler did not show DVT GI was also following during this hospitalization, now signed off.  We recommend to follow-up with GI as an outpatient.  He needs to continue lactulose and rifaximin   Bilateral upper extremity nonpurulent cellulitis Finished the course of antibiotics   Depression Psychiatry consulted, he  does not meet inpatient psychiatric placement, psychiatry signed off   Alcohol abuse/homeless Alcohol level on presentation 81 Social worker consulted, follow up with alcohol treatment facility [facility] in [place]   Class III obesity  Discharge Diagnoses:  Principal Problem:   Alcoholic cirrhosis ([place]) Active Problems:   Hepatic encephalopathy ([place])   Thrombocytopenia ([place])   Cellulitis   Homelessness    Discharge Instructions  Discharge Instructions     Diet - low sodium heart healthy   Complete by: As directed    Diet general   Complete by: As directed    2g low sodium diet   Discharge instructions   Complete by: As directed    1)Please take prescribed medications as instructed. 2)Follow up with your PCP in a week. 3)Follow with gastroenterology in 4 weeks.  Name and number of the provider has been addressed. 4)Follow up at the alcohol rehabilitation center.Quit alcohol   Increase activity slowly   Complete by: As directed    Increase activity slowly   Complete by: As directed       Allergies as of 02/25/2021       Reactions   Ace Inhibitors Swelling   Tongue swelling   Other Hives   Figs cause hives        Medication List     STOP taking these medications    carvedilol 3.125 MG tablet Commonly known as: COREG       TAKE these medications    albuterol 108 (90 Base) MCG/ACT inhaler Commonly known as: VENTOLIN HFA Inhale 1-2 puffs into the lungs every 4 (four) hours as needed for wheezing or shortness of breath.   Contour Next Test test strip Generic drug: glucose blood SMARTSIG:Via Meter   folic acid 1 MG tablet Commonly known as:  FOLVITE Take 1 tablet (1 mg total) by mouth daily.   furosemide 40 MG tablet Commonly known as: LASIX Take 1 tablet (40 mg total) by mouth 2 (two) times daily.   lactulose 10 GM/15ML solution Commonly known as: CHRONULAC Take 45 mLs (30 g total) by mouth 3 (three) times daily. Hold if more than 3 loose stools  a day What changed:  how much to take how to take this when to take this additional instructions   pantoprazole 40 MG tablet Commonly known as: PROTONIX Take 1 tablet (40 mg total) by mouth 2 (two) times daily.   spironolactone 100 MG tablet Commonly known as: ALDACTONE Take 1 tablet (100 mg total) by mouth daily.   thiamine 100 MG tablet Take 1 tablet (100 mg total) by mouth daily.   Xifaxan 550 MG Tabs tablet Generic drug: rifaximin Take 1 tablet (550 mg total) by mouth 2 (two) times daily.   zinc sulfate 220 (50 Zn) MG capsule Take 1 capsule (220 mg total) by mouth daily.        Follow-up Information     Services, [facility] Follow up.   Contact information: [place] [address] [phone]         Kerin Perna, NP. Schedule an appointment as soon as possible for a visit in 1 week(s).   Specialty: Internal Medicine Contact information: [place] [address] [phone]         Arta Silence, MD Follow up.   Specialty: Gastroenterology Why: for cirrhosis  Contact information: [address] [place] [address] [phone]                Allergies  Allergen Reactions   Ace Inhibitors Swelling    Tongue swelling   Other Hives    Figs cause hives    Consultations: GI, psychiatry   Procedures/Studies: DG Chest Port 1 View  Result Date: 02/05/2021 CLINICAL DATA:  Shortness of breath. EXAM: PORTABLE CHEST 1 VIEW COMPARISON:  Chest x-ray 11/01/2020 FINDINGS: The heart size and mediastinal contours are unchanged. Slightly prominent hilar vasculature as prior. No focal consolidation. No pulmonary edema. No pleural effusion. No pneumothorax. No acute osseous abnormality. IMPRESSION: No active disease. Electronically Signed   By: Iven Finn M.D.   On: 02/05/2021 05:58   DG Knee Complete 4 Views Left  Result Date: 02/17/2021 CLINICAL DATA:  Knee pain. EXAM: LEFT KNEE  - COMPLETE 4+ VIEW COMPARISON:  None. FINDINGS: No evidence of fracture, dislocation, or joint effusion. Enthesopathy of the patella superiorly and inferiorly. Nonaggressive appearing periosteal reaction along the proximal tibia. Mild tibiofemoral joint space narrowing. No aggressive appearing focal bone abnormality. Soft tissues are unremarkable. IMPRESSION: No acute displaced fracture or dislocation. Electronically Signed   By: Iven Finn M.D.   On: 02/17/2021 00:49   US SCROTUM W/DOPPLER  Result Date: 02/17/2021 CLINICAL DATA:  60 year old with right testicular pain. EXAM: SCROTAL ULTRASOUND DOPPLER ULTRASOUND OF THE TESTICLES TECHNIQUE: Complete ultrasound examination of the testicles, epididymis, and other scrotal structures was performed. Color and spectral Doppler ultrasound were also utilized to evaluate blood flow to the testicles. COMPARISON:  None. FINDINGS: Right testicle Measurements: 4.2 x 2.5 x 2.7 cm. Homogeneous echogenicity. Normal blood flow. No mass or microlithiasis visualized. Left testicle Measurements: 4.0 x 2.4 x 2.6 cm. Homogeneous echogenicity. Normal blood flow. No mass or microlithiasis visualized. Right epididymis:  Normal in size and appearance. Left epididymis:  Normal in size and  appearance. Hydrocele:  None visualized. Varicocele:  None visualized. Pulsed Doppler interrogation of both testes demonstrates normal low resistance arterial and venous waveforms bilaterally. Other: Technically challenging exam due to patient movement and medical condition. IMPRESSION: Unremarkable scrotal ultrasound. Electronically Signed   By: Keith Rake M.D.   On: 02/17/2021 03:30   VAS [place] LOWER EXTREMITY VENOUS (DVT)  Result Date: 02/20/2021  Lower Venous DVT Study Patient Name:  KIYOSHI SCHAAB  Date of Exam:   02/20/2021 Medical Rec #: [id number]      Accession #:    [id number] Date of Birth: [birth year]     Patient Gender: M Patient Age:   59Y Exam Location:  [facility]  Procedure:      VAS [place] LOWER EXTREMITY VENOUS (DVT) Referring Phys: [id number] FANG XU --------------------------------------------------------------------------------  Indications: Edema.  Risk Factors: None identified. Limitations: Body habitus and poor ultrasound/tissue interface. Comparison Study: No prior studies. Performing Technologist: Oliver Hum RVT  Examination Guidelines: A complete evaluation includes B-mode imaging, spectral Doppler, color Doppler, and power Doppler as needed of all accessible portions of each vessel. Bilateral testing is considered an integral part of a complete examination. Limited examinations for reoccurring indications may be performed as noted. The reflux portion of the exam is performed with the patient in reverse Trendelenburg.  +---------+---------------+---------+-----------+----------+--------------+ RIGHT    CompressibilityPhasicitySpontaneityPropertiesThrombus Aging +---------+---------------+---------+-----------+----------+--------------+ CFV      Full           Yes      Yes                                 +---------+---------------+---------+-----------+----------+--------------+ SFJ      Full                                                        +---------+---------------+---------+-----------+----------+--------------+ FV Prox  Full                                                        +---------+---------------+---------+-----------+----------+--------------+ FV Mid   Full                                                        +---------+---------------+---------+-----------+----------+--------------+ FV DistalFull                                                        +---------+---------------+---------+-----------+----------+--------------+ PFV      Full                                                        +---------+---------------+---------+-----------+----------+--------------+ POP  Full           Yes       Yes                                 +---------+---------------+---------+-----------+----------+--------------+ PTV      Full                                                        +---------+---------------+---------+-----------+----------+--------------+ PERO     Full                                                        +---------+---------------+---------+-----------+----------+--------------+   +---------+---------------+---------+-----------+----------+--------------+ LEFT     CompressibilityPhasicitySpontaneityPropertiesThrombus Aging +---------+---------------+---------+-----------+----------+--------------+ CFV      Full           Yes      Yes                                 +---------+---------------+---------+-----------+----------+--------------+ SFJ      Full                                                        +---------+---------------+---------+-----------+----------+--------------+ FV Prox  Full                                                        +---------+---------------+---------+-----------+----------+--------------+ FV Mid   Full                                                        +---------+---------------+---------+-----------+----------+--------------+ FV DistalFull                                                        +---------+---------------+---------+-----------+----------+--------------+ PFV      Full                                                        +---------+---------------+---------+-----------+----------+--------------+ POP      Full           Yes      Yes                                 +---------+---------------+---------+-----------+----------+--------------+  PTV      Full                                                        +---------+---------------+---------+-----------+----------+--------------+ PERO     Full                                                         +---------+---------------+---------+-----------+----------+--------------+     Summary: RIGHT: - There is no evidence of deep vein thrombosis in the lower extremity. However, portions of this examination were limited- see technologist comments above.  - No cystic structure found in the popliteal fossa.  LEFT: - There is no evidence of deep vein thrombosis in the lower extremity. However, portions of this examination were limited- see technologist comments above.  - No cystic structure found in the popliteal fossa.  *See table(s) above for measurements and observations. Electronically signed by Harold Barban MD on 02/20/2021 at 2:01:12 PM.    Final    US Abdomen Limited RUQ (LIVER/GB)  Result Date: 02/18/2021 CLINICAL DATA:  Elevated liver enzymes. EXAM: ULTRASOUND ABDOMEN LIMITED RIGHT UPPER QUADRANT COMPARISON:  None. FINDINGS: Gallbladder: Calcified gallstones within the gallbladder lumen. Gallbladder wall thickening. No pericholecystic fluid visualized. No sonographic Murphy sign noted by sonographer. Common bile duct: Diameter: 4 mm. Liver: Nodular hepatic contour. No focal lesion identified. Coarsened and decrease parenchymal echogenicity. Portal vein is patent on color Doppler imaging with normal direction of blood flow towards the liver. Other: Tips is noted and is patent. IMPRESSION: 1. Cholelithiasis with associated gallbladder wall thickening which can be seen in the setting of chronic liver disease versus acute cholecystitis. Findings equivocal for acute cholecystitis with no pericholecystic fluid or sonographic Murphy sign recorded. Correlate clinically. 2. Cirrhosis with patent TIPS. Consider nonemergent MRI liver protocol further evaluation. Electronically Signed   By: Iven Finn M.D.   On: 02/18/2021 02:42      Subjective: Patient seen and examined at the bedside this morning.  Hemodynamically stable for discharge.  Discharge Exam: Vitals:   02/24/21 2039 02/25/21 0357  BP: (!)  141/74 (!) 143/75  Pulse: 86 92  Resp: 18 18  Temp: 98 F (36.7 C) 98.4 F (36.9 C)  SpO2: 98% 96%   Vitals:   02/24/21 0545 02/24/21 1333 02/24/21 2039 02/25/21 0357  BP: 105/68 109/69 (!) 141/74 (!) 143/75  Pulse: 76 75 86 92  Resp: $Remo'16 18 18 18  'WFSGd$ Temp: 98.5 F (36.9 C) (!) 97.5 F (36.4 C) 98 F (36.7 C) 98.4 F (36.9 C)  TempSrc:   Oral Oral  SpO2: 95% 94% 98% 96%  Weight:      Height:        General: Pt is alert, awake, not in acute distress Cardiovascular: RRR, S1/S2 +, no rubs, no gallops Respiratory: CTA bilaterally, no wheezing, no rhonchi Abdominal: Soft, NT, ND, bowel sounds + Extremities: no edema, no cyanosis    The results of significant diagnostics from this hospitalization (including imaging, microbiology, ancillary and laboratory) are listed below for reference.     Microbiology: Recent Results (from the past 240 hour(s))  Resp Panel by RT-PCR (Flu A&B, Covid) Nasopharyngeal Swab  Status: None   Collection Time: 02/17/21  3:27 AM   Specimen: Nasopharyngeal Swab; Nasopharyngeal(NP) swabs in vial transport medium  Result Value Ref Range Status   SARS Coronavirus 2 by RT PCR NEGATIVE NEGATIVE Final    Comment: (NOTE) SARS-CoV-2 target nucleic acids are NOT DETECTED.  The SARS-CoV-2 RNA is generally detectable in upper respiratory specimens during the acute phase of infection. The lowest concentration of SARS-CoV-2 viral copies this assay can detect is 138 copies/mL. A negative result does not preclude SARS-Cov-2 infection and should not be used as the sole basis for treatment or other patient management decisions. A negative result may occur with  improper specimen collection/handling, submission of specimen other than nasopharyngeal swab, presence of viral mutation(s) within the areas targeted by this assay, and inadequate number of viral copies(<138 copies/mL). A negative result must be combined with clinical observations, patient history, and  epidemiological information. The expected result is Negative.  Fact Sheet for Patients:  EntrepreneurPulse.com.au  Fact Sheet for Healthcare Providers:  IncredibleEmployment.be  This test is no t yet approved or cleared by the [place] FDA and  has been authorized for detection and/or diagnosis of SARS-CoV-2 by FDA under an Emergency Use Authorization (EUA). This EUA will remain  in effect (meaning this test can be used) for the duration of the COVID-19 declaration under Section 564(b)(1) of the Act, 21 U.S.C.section 360bbb-3(b)(1), unless the authorization is terminated  or revoked sooner.       Influenza A by PCR NEGATIVE NEGATIVE Final   Influenza B by PCR NEGATIVE NEGATIVE Final    Comment: (NOTE) The Xpert Xpress SARS-CoV-2/FLU/RSV plus assay is intended as an aid in the diagnosis of influenza from Nasopharyngeal swab specimens and should not be used as a sole basis for treatment. Nasal washings and aspirates are unacceptable for Xpert Xpress SARS-CoV-2/FLU/RSV testing.  Fact Sheet for Patients: EntrepreneurPulse.com.au  Fact Sheet for Healthcare Providers: IncredibleEmployment.be  This test is not yet approved or cleared by the [place] FDA and has been authorized for detection and/or diagnosis of SARS-CoV-2 by FDA under an Emergency Use Authorization (EUA). This EUA will remain in effect (meaning this test can be used) for the duration of the COVID-19 declaration under Section 564(b)(1) of the Act, 21 U.S.C. section 360bbb-3(b)(1), unless the authorization is terminated or revoked.  Performed at [facility], [place] [address] [place], [place] [address]      Labs: BNP (last 3 results) Recent Labs    02/05/21 0530  BNP 37.9   Basic Metabolic Panel: Recent Labs  Lab 02/19/21 0513 02/20/21 0522 02/21/21 0503 02/22/21 0547  NA 135 134* 133* 133*  K 4.4 4.4  4.3 4.1  CL 106 104 103 101  CO2 $Re'27 27 26 27  'KJC$ GLUCOSE 142* 181* 189* 112*  BUN $Re'6 6 7 8  'eBK$ CREATININE 0.66 0.68 0.59* 0.65  CALCIUM 8.5* 8.7* 8.5* 8.8*   Liver Function Tests: Recent Labs  Lab 02/19/21 0513 02/20/21 0522 02/21/21 0503 02/22/21 0547  AST 132* 104* 87* 94*  ALT 77* 74* 66* 67*  ALKPHOS 251* 280* 244* 236*  BILITOT 4.5* 5.0* 5.4* 6.2*  PROT 6.1* 5.8* 5.6* 6.2*  ALBUMIN 2.1* 2.1* 2.0* 2.2*   No results for input(s): LIPASE, AMYLASE in the last 168 hours. No results for input(s): AMMONIA in the last 168 hours. CBC: Recent Labs  Lab 02/19/21 0513 02/20/21 0522 02/21/21 0503 02/22/21 0547  WBC 8.5 8.0 8.7 8.6  HGB 13.0 13.2 13.1 14.0  HCT  38.4* 39.5 38.1* 40.3  MCV 102.9* 103.4* 101.6* 100.5*  PLT 60* 56* 59* 64*   Cardiac Enzymes: No results for input(s): CKTOTAL, CKMB, CKMBINDEX, TROPONINI in the last 168 hours. BNP: Invalid input(s): POCBNP CBG: No results for input(s): GLUCAP in the last 168 hours. D-Dimer No results for input(s): DDIMER in the last 72 hours. Hgb A1c No results for input(s): HGBA1C in the last 72 hours. Lipid Profile No results for input(s): CHOL, HDL, LDLCALC, TRIG, CHOLHDL, LDLDIRECT in the last 72 hours. Thyroid function studies No results for input(s): TSH, T4TOTAL, T3FREE, THYROIDAB in the last 72 hours.  Invalid input(s): FREET3 Anemia work up No results for input(s): VITAMINB12, FOLATE, FERRITIN, TIBC, IRON, RETICCTPCT in the last 72 hours. Urinalysis    Component Value Date/Time   COLORURINE AMBER (A) 02/17/2021 0148   APPEARANCEUR HAZY (A) 02/17/2021 0148   LABSPEC 1.028 02/17/2021 0148   PHURINE 5.0 02/17/2021 0148   GLUCOSEU NEGATIVE 02/17/2021 0148   HGBUR NEGATIVE 02/17/2021 0148   BILIRUBINUR SMALL (A) 02/17/2021 0148   KETONESUR NEGATIVE 02/17/2021 0148   PROTEINUR NEGATIVE 02/17/2021 0148   NITRITE NEGATIVE 02/17/2021 0148   LEUKOCYTESUR NEGATIVE 02/17/2021 0148   Sepsis Labs Invalid input(s):  PROCALCITONIN,  WBC,  LACTICIDVEN Microbiology Recent Results (from the past 240 hour(s))  Resp Panel by RT-PCR (Flu A&B, Covid) Nasopharyngeal Swab     Status: None   Collection Time: 02/17/21  3:27 AM   Specimen: Nasopharyngeal Swab; Nasopharyngeal(NP) swabs in vial transport medium  Result Value Ref Range Status   SARS Coronavirus 2 by RT PCR NEGATIVE NEGATIVE Final    Comment: (NOTE) SARS-CoV-2 target nucleic acids are NOT DETECTED.  The SARS-CoV-2 RNA is generally detectable in upper respiratory specimens during the acute phase of infection. The lowest concentration of SARS-CoV-2 viral copies this assay can detect is 138 copies/mL. A negative result does not preclude SARS-Cov-2 infection and should not be used as the sole basis for treatment or other patient management decisions. A negative result may occur with  improper specimen collection/handling, submission of specimen other than nasopharyngeal swab, presence of viral mutation(s) within the areas targeted by this assay, and inadequate number of viral copies(<138 copies/mL). A negative result must be combined with clinical observations, patient history, and epidemiological information. The expected result is Negative.  Fact Sheet for Patients:  EntrepreneurPulse.com.au  Fact Sheet for Healthcare Providers:  IncredibleEmployment.be  This test is no t yet approved or cleared by the [place] FDA and  has been authorized for detection and/or diagnosis of SARS-CoV-2 by FDA under an Emergency Use Authorization (EUA). This EUA will remain  in effect (meaning this test can be used) for the duration of the COVID-19 declaration under Section 564(b)(1) of the Act, 21 U.S.C.section 360bbb-3(b)(1), unless the authorization is terminated  or revoked sooner.       Influenza A by PCR NEGATIVE NEGATIVE Final   Influenza B by PCR NEGATIVE NEGATIVE Final    Comment: (NOTE) The Xpert Xpress  SARS-CoV-2/FLU/RSV plus assay is intended as an aid in the diagnosis of influenza from Nasopharyngeal swab specimens and should not be used as a sole basis for treatment. Nasal washings and aspirates are unacceptable for Xpert Xpress SARS-CoV-2/FLU/RSV testing.  Fact Sheet for Patients: EntrepreneurPulse.com.au  Fact Sheet for Healthcare Providers: IncredibleEmployment.be  This test is not yet approved or cleared by the [place] FDA and has been authorized for detection and/or diagnosis of SARS-CoV-2 by FDA under an Emergency Use Authorization (EUA). This EUA will  remain in effect (meaning this test can be used) for the duration of the COVID-19 declaration under Section 564(b)(1) of the Act, 21 U.S.C. section 360bbb-3(b)(1), unless the authorization is terminated or revoked.  Performed at [facility], [place] [address], [place], [place] [address]     Please note: You were cared for by a hospitalist during your hospital stay. Once you are discharged, your primary care physician will handle any further medical issues. Please note that NO REFILLS for any discharge medications will be authorized once you are discharged, as it is imperative that you return to your primary care physician (or establish a relationship with a primary care physician if you do not have one) for your post hospital discharge needs so that they can reassess your need for medications and monitor your lab values.    Time coordinating discharge: 40 minutes  SIGNED:   Shelly Coss, MD  [facility] 02/25/2021, 9:46 AM Pager [phone]  If 7PM-7AM, please contact night-coverage [email or web address] Password [email or web address]

## 2021-02-26 MED ORDER — DIPHENHYDRAMINE HCL 25 MG PO CAPS
25.0000 mg | ORAL_CAPSULE | Freq: Four times a day (QID) | ORAL | Status: DC | PRN
  Administered 2021-02-26: 25 mg via ORAL
  Filled 2021-02-26: qty 1

## 2021-02-26 NOTE — Progress Notes (Signed)
Patient seen and examined bedside this morning.  Hemodynamically stable.  Sitting on the chair, no complaints.  He is waiting to go to the hotel.  He said he is paycheck will be delivered tonight and he can go tomorrow.  Patient is medically stable for discharge.  Discharge summary and orders are in.

## 2021-02-27 NOTE — TOC Transition Note (Signed)
Transition of Care [facility]) - CM/SW Discharge Note   Patient Details  Name: Angel Webster MRN: [id number] Date of Birth: November 27, 1960  [facility] CM/SW Contact:  Ida Rogue, LCSW Phone Number: 02/27/2021, 11:43 AM   Clinical Narrative:  Patient who is stable for d/c will go to [place] today as he has a check to pay for lodging for at least a week.  Initially was going to send him by [facility] transport, patient became anxious to leave and called for [place].  Never did hear back from [facility] about whether he was accepted, but patient left with number to call, and confirmed that he would follow up post d/c.  No further needs identified.  TOC sign off.     Final next level of care: Home/Self Care Barriers to Discharge: Barriers Resolved   Patient Goals and CMS Choice        Discharge Placement                       Discharge Plan and Services In-house Referral: Clinical Social Work                                   Social Determinants of Health (SDOH) Interventions     Readmission Risk Interventions No flowsheet data found.

## 2021-02-27 NOTE — Progress Notes (Signed)
Patient seen and examined the bedside this morning.  Hemodynamically stable.  Looks comfortable.  He is medically stable for discharge today.  Discharge orders and summary are already in.  No  change in the medical management

## 2021-02-28 ENCOUNTER — Telehealth: Payer: Self-pay

## 2021-02-28 ENCOUNTER — Other Ambulatory Visit (INDEPENDENT_AMBULATORY_CARE_PROVIDER_SITE_OTHER): Payer: Self-pay | Admitting: Primary Care

## 2021-02-28 DIAGNOSIS — R601 Generalized edema: Secondary | ICD-10-CM

## 2021-02-28 DIAGNOSIS — I10 Essential (primary) hypertension: Secondary | ICD-10-CM

## 2021-02-28 DIAGNOSIS — K7030 Alcoholic cirrhosis of liver without ascites: Secondary | ICD-10-CM

## 2021-02-28 NOTE — Telephone Encounter (Signed)
Call returned to [facility].  Voicemail full, unable to leave a message. Need to inquire if he knows how to reach the patient.

## 2021-02-28 NOTE — Telephone Encounter (Signed)
Copied from CRM [id number]. Topic: Quick Communication - Rx Refill/Question >> Feb 28, 2021  4:39 PM Gaetana Michaelis A wrote: Medication: folic acid (FOLVITE) 1 MG tablet [[id number]]   furosemide (LASIX) 40 MG tablet [[id number]]   lactulose (CHRONULAC) 10 GM/15ML solution [[id number]]   spironolactone (ALDACTONE) 100 MG tablet [[id number]]   thiamine 100 MG tablet [[id number]]   XIFAXAN 550 MG TABS tablet [[id number]]   Has the patient contacted their pharmacy? No. This will be patient's first time using this pharmacy. They have recently moved.   Preferred Pharmacy (with phone number or street name): [id number] - [place] ([place]), [place] - [address]  Phone:  [phone] Fax:  [phone]   Agent: Please be advised that RX refills may take up to 3 business days. We ask that you follow-up with your pharmacy.

## 2021-02-28 NOTE — Telephone Encounter (Signed)
Transition Care Management Unsuccessful Follow-up Telephone Call  Date of discharge and from where:  02/27/2021 from [facility]  Attempts:  1st Attempt  Reason for unsuccessful TCM follow-up call:  Missing or invalid number

## 2021-02-28 NOTE — Telephone Encounter (Signed)
Transition Care Management Unsuccessful Follow-up Telephone Call  Date of discharge and from where:  02/27/2021, [facility]   Attempts:  1st Attempt  Reason for unsuccessful TCM follow-up call:  Unable to reach patient  No phone number for patient.  Call placed to cousin, Katha Hamming # [phone] - DPR on file to speak with Barbara Cower.  voicemail was full.    Patient's sister, Otelia Limes is listed as a contact.  Call placed to her # [phone] to inquire if there is a way to contact the patient.  Message left with call back requested to this CM.   Patient's appointment at [facility] is not until 04/23/2021

## 2021-02-28 NOTE — Telephone Encounter (Signed)
Angel Webster returning your call regarding his family member (patient)

## 2021-02-28 NOTE — Telephone Encounter (Signed)
Requested medication (s) are due for refill today: requesting sent to new pharmacy  Requested medication (s) are on the active medication list: yes  Last refill:  folvite-02/25/21 #30 0 refills, lasix- 02/25/21 #60 0 refills, lactulose- 02/25/21 #1892 ml 1 refill, aldactone- 02/25/21 #30 0 refills, thiamine- 02/25/21 #30 0 refills, xifaxan- 02/25/21-03/27/21 #60 0 refills  Future visit scheduled: yes in 1 month  Notes to clinic:  medication not assigned to a protocol, last ordered by Burnadette Pop, MD. Do you want to refill?     Requested Prescriptions  Pending Prescriptions Disp Refills   XIFAXAN 550 MG TABS tablet 60 tablet 0    Sig: Take 1 tablet (550 mg total) by mouth 2 (two) times daily.      Off-Protocol Failed - 02/28/2021  5:48 PM      Failed - Medication not assigned to a protocol, review manually.      Passed - Valid encounter within last 12 months    Recent Outpatient Visits           1 month ago Essential hypertension   [facility] Gwinda Passe P, NP   3 months ago Gastroesophageal reflux disease without esophagitis   [facility] Grayce Sessions, NP   4 months ago Encounter to establish care   [facility] Grayce Sessions, NP       Future Appointments             In 1 month Edwards, Kinnie Scales, NP [facility]               thiamine 100 MG tablet 30 tablet 0    Sig: Take 1 tablet (100 mg total) by mouth daily.      Off-Protocol Failed - 02/28/2021  5:48 PM      Failed - Medication not assigned to a protocol, review manually.      Passed - Valid encounter within last 12 months    Recent Outpatient Visits           1 month ago Essential hypertension   [facility] Gwinda Passe P, NP   3 months ago Gastroesophageal reflux disease without esophagitis   [facility] Grayce Sessions, NP   4 months ago  Encounter to establish care   [facility] Grayce Sessions, NP       Future Appointments             In 1 month Edwards, Kinnie Scales, NP [facility]               spironolactone (ALDACTONE) 100 MG tablet 30 tablet 0    Sig: Take 1 tablet (100 mg total) by mouth daily.      Cardiovascular: Diuretics - Aldosterone Antagonist Failed - 02/28/2021  5:48 PM      Failed - Na in normal range and within 360 days    Sodium  Date Value Ref Range Status  02/22/2021 133 (L) 135 - 145 mmol/L Final  10/26/2020 133 (L) 134 - 144 mmol/L Final          Passed - Cr in normal range and within 360 days    Creatinine, Ser  Date Value Ref Range Status  02/22/2021 0.65 0.61 - 1.24 mg/dL Final          Passed - K in normal range and within 360  days    Potassium  Date Value Ref Range Status  02/22/2021 4.1 3.5 - 5.1 mmol/L Final          Passed - Last BP in normal range    BP Readings from Last 1 Encounters:  02/27/21 (!) 107/51          Passed - Valid encounter within last 6 months    Recent Outpatient Visits           1 month ago Essential hypertension   [facility] Gwinda Passe P, NP   3 months ago Gastroesophageal reflux disease without esophagitis   [facility] Grayce Sessions, NP   4 months ago Encounter to establish care   [facility] Grayce Sessions, NP       Future Appointments             In 1 month Edwards, Kinnie Scales, NP [facility]               lactulose (CHRONULAC) 10 GM/15ML solution 1892 mL 1    Sig: Take 45 mLs (30 g total) by mouth 3 (three) times daily. Hold if more than 3 loose stools a day      Gastroenterology:  Laxatives Passed - 02/28/2021  5:48 PM      Passed - Valid encounter within last 12 months    Recent Outpatient Visits           1 month ago Essential hypertension   [facility] Gwinda Passe P, NP   3 months ago Gastroesophageal reflux disease without esophagitis   [facility] Grayce Sessions, NP   4 months ago Encounter to establish care   [facility] Grayce Sessions, NP       Future Appointments             In 1 month Edwards, Kinnie Scales, NP [facility]               furosemide (LASIX) 40 MG tablet 60 tablet 0    Sig: Take 1 tablet (40 mg total) by mouth 2 (two) times daily.      Cardiovascular:  Diuretics - Loop Failed - 02/28/2021  5:48 PM      Failed - Ca in normal range and within 360 days    Calcium  Date Value Ref Range Status  02/22/2021 8.8 (L) 8.9 - 10.3 mg/dL Final          Failed - Na in normal range and within 360 days    Sodium  Date Value Ref Range Status  02/22/2021 133 (L) 135 - 145 mmol/L Final  10/26/2020 133 (L) 134 - 144 mmol/L Final          Passed - K in normal range and within 360 days    Potassium  Date Value Ref Range Status  02/22/2021 4.1 3.5 - 5.1 mmol/L Final          Passed - Cr in normal range and within 360 days    Creatinine, Ser  Date Value Ref Range Status  02/22/2021 0.65 0.61 - 1.24 mg/dL Final          Passed - Last BP in normal range    BP Readings from Last 1 Encounters:  02/27/21 (!) 107/51          Passed -  Valid encounter within last 6 months    Recent Outpatient Visits           1 month ago Essential hypertension   [facility] Gwinda Passe P, NP   3 months ago Gastroesophageal reflux disease without esophagitis   [facility] Grayce Sessions, NP   4 months ago Encounter to establish care   [facility] Grayce Sessions, NP       Future Appointments             In 1 month Edwards, Kinnie Scales, NP [facility]               folic acid (FOLVITE) 1 MG tablet 30 tablet 0     Sig: Take 1 tablet (1 mg total) by mouth daily.      Endocrinology:  Vitamins Passed - 02/28/2021  5:48 PM      Passed - Valid encounter within last 12 months    Recent Outpatient Visits           1 month ago Essential hypertension   [facility] Grayce Sessions, NP   3 months ago Gastroesophageal reflux disease without esophagitis   [facility] Grayce Sessions, NP   4 months ago Encounter to establish care   [facility] Grayce Sessions, NP       Future Appointments             In 1 month Randa Evens, Kinnie Scales, NP [facility]

## 2021-03-01 ENCOUNTER — Telehealth: Payer: Self-pay

## 2021-03-01 NOTE — Telephone Encounter (Signed)
Transition Care Management Unsuccessful Follow-up Telephone Call  Date of discharge and from where:  02/27/2021 - [facility]   Attempts:  2nd Attempt  Reason for unsuccessful TCM follow-up call:  Missing or invalid number

## 2021-03-01 NOTE — Telephone Encounter (Signed)
Transition Care Management Unsuccessful Follow-up Telephone Call  Date of discharge and from where:  02/27/2021, [facility]   Attempts:  2nd  Attempt  Reason for unsuccessful TCM follow-up call:  Unable to reach patient  No phone number for patient.  Call placed to cousin, Katha Hamming # [phone] - DPR on file to speak with Barbara Cower. Identity confirmed.Mr Barbara Cower stated he does not know here is his cousin at this time all he know is that he is homeless and has no contact nr to reach him. Instructed if he talk to his cousin to let him know this RN called. Contact nr and info given to the cousin      Patient's appointment at [facility] is not until 04/23/2021

## 2021-03-04 ENCOUNTER — Telehealth: Payer: Self-pay

## 2021-03-04 NOTE — Telephone Encounter (Signed)
Transition Care Management Unsuccessful Follow-up Telephone Call  Date of discharge and from where:  02/27/2021 - [facility]  Attempts:  3rd Attempt  Reason for unsuccessful TCM follow-up call:  Missing or invalid number

## 2021-03-04 NOTE — Telephone Encounter (Signed)
Transition Care Management Unsuccessful Follow-up Telephone Call  Date of discharge and from where:  02/27/2021, [facility]  Attempts:  3rd Attempt  Reason for unsuccessful TCM follow-up call:  Unable to reach patient - he is homeless and has no phone.  His cousin, Angel Webster does not know where the patient is.    Patient's sister, Angel Webster is listed as a contact.  Call placed to her # [phone] to inquire if there is a way to contact the patient.  Message left with call back requested to this CM.   Patient has an appointment at [facility] 04/23/2021.

## 2021-03-05 ENCOUNTER — Encounter (HOSPITAL_COMMUNITY): Payer: Self-pay | Admitting: Emergency Medicine

## 2021-03-05 ENCOUNTER — Inpatient Hospital Stay (HOSPITAL_COMMUNITY): Payer: Medicaid Other

## 2021-03-05 ENCOUNTER — Emergency Department (HOSPITAL_COMMUNITY): Payer: Medicaid Other

## 2021-03-05 ENCOUNTER — Other Ambulatory Visit: Payer: Self-pay

## 2021-03-05 ENCOUNTER — Inpatient Hospital Stay (HOSPITAL_COMMUNITY)
Admission: EM | Admit: 2021-03-05 | Discharge: 2021-03-06 | DRG: 433 | Disposition: A | Discharge status: Home / Self Care | Payer: Medicaid Other | Attending: Internal Medicine | Admitting: Internal Medicine

## 2021-03-05 DIAGNOSIS — Z5901 Sheltered homelessness: Secondary | ICD-10-CM

## 2021-03-05 DIAGNOSIS — T473X6A Underdosing of saline and osmotic laxatives, initial encounter: Secondary | ICD-10-CM | POA: Diagnosis present

## 2021-03-05 DIAGNOSIS — M79604 Pain in right leg: Secondary | ICD-10-CM | POA: Diagnosis present

## 2021-03-05 DIAGNOSIS — K7030 Alcoholic cirrhosis of liver without ascites: Secondary | ICD-10-CM | POA: Diagnosis present

## 2021-03-05 DIAGNOSIS — K7290 Hepatic failure, unspecified without coma: Secondary | ICD-10-CM

## 2021-03-05 DIAGNOSIS — I110 Hypertensive heart disease with heart failure: Secondary | ICD-10-CM | POA: Diagnosis present

## 2021-03-05 DIAGNOSIS — T366X6A Underdosing of rifampicins, initial encounter: Secondary | ICD-10-CM | POA: Diagnosis present

## 2021-03-05 DIAGNOSIS — F1010 Alcohol abuse, uncomplicated: Secondary | ICD-10-CM | POA: Diagnosis present

## 2021-03-05 DIAGNOSIS — Z91138 Patient's unintentional underdosing of medication regimen for other reason: Secondary | ICD-10-CM

## 2021-03-05 DIAGNOSIS — E8779 Other fluid overload: Secondary | ICD-10-CM | POA: Diagnosis present

## 2021-03-05 DIAGNOSIS — I10 Essential (primary) hypertension: Secondary | ICD-10-CM | POA: Diagnosis not present

## 2021-03-05 DIAGNOSIS — K7010 Alcoholic hepatitis without ascites: Secondary | ICD-10-CM | POA: Diagnosis present

## 2021-03-05 DIAGNOSIS — D696 Thrombocytopenia, unspecified: Secondary | ICD-10-CM | POA: Diagnosis present

## 2021-03-05 DIAGNOSIS — R0789 Other chest pain: Secondary | ICD-10-CM | POA: Diagnosis not present

## 2021-03-05 DIAGNOSIS — Z888 Allergy status to other drugs, medicaments and biological substances status: Secondary | ICD-10-CM | POA: Diagnosis not present

## 2021-03-05 DIAGNOSIS — E871 Hypo-osmolality and hyponatremia: Secondary | ICD-10-CM | POA: Diagnosis present

## 2021-03-05 DIAGNOSIS — K5900 Constipation, unspecified: Secondary | ICD-10-CM | POA: Diagnosis not present

## 2021-03-05 DIAGNOSIS — E8809 Other disorders of plasma-protein metabolism, not elsewhere classified: Secondary | ICD-10-CM | POA: Diagnosis present

## 2021-03-05 DIAGNOSIS — Z79899 Other long term (current) drug therapy: Secondary | ICD-10-CM

## 2021-03-05 DIAGNOSIS — J449 Chronic obstructive pulmonary disease, unspecified: Secondary | ICD-10-CM | POA: Diagnosis present

## 2021-03-05 DIAGNOSIS — E860 Dehydration: Secondary | ICD-10-CM

## 2021-03-05 DIAGNOSIS — T500X6A Underdosing of mineralocorticoids and their antagonists, initial encounter: Secondary | ICD-10-CM | POA: Diagnosis present

## 2021-03-05 DIAGNOSIS — Z5900 Homelessness unspecified: Secondary | ICD-10-CM

## 2021-03-05 DIAGNOSIS — R079 Chest pain, unspecified: Secondary | ICD-10-CM

## 2021-03-05 DIAGNOSIS — Z20822 Contact with and (suspected) exposure to covid-19: Secondary | ICD-10-CM | POA: Diagnosis present

## 2021-03-05 DIAGNOSIS — K7682 Hepatic encephalopathy: Secondary | ICD-10-CM

## 2021-03-05 DIAGNOSIS — I5032 Chronic diastolic (congestive) heart failure: Secondary | ICD-10-CM | POA: Diagnosis present

## 2021-03-05 DIAGNOSIS — M79605 Pain in left leg: Secondary | ICD-10-CM | POA: Diagnosis present

## 2021-03-05 DIAGNOSIS — D6959 Other secondary thrombocytopenia: Secondary | ICD-10-CM | POA: Diagnosis present

## 2021-03-05 DIAGNOSIS — N179 Acute kidney failure, unspecified: Secondary | ICD-10-CM | POA: Diagnosis present

## 2021-03-05 DIAGNOSIS — G9340 Encephalopathy, unspecified: Secondary | ICD-10-CM | POA: Diagnosis present

## 2021-03-05 DIAGNOSIS — F17220 Nicotine dependence, chewing tobacco, uncomplicated: Secondary | ICD-10-CM | POA: Diagnosis present

## 2021-03-05 DIAGNOSIS — K7040 Alcoholic hepatic failure without coma: Principal | ICD-10-CM | POA: Diagnosis present

## 2021-03-05 DIAGNOSIS — N189 Chronic kidney disease, unspecified: Secondary | ICD-10-CM | POA: Diagnosis not present

## 2021-03-05 LAB — COMPREHENSIVE METABOLIC PANEL WITH GFR
ALT: 102 U/L — ABNORMAL HIGH (ref 0–44)
AST: 217 U/L — ABNORMAL HIGH (ref 15–41)
Albumin: 2.4 g/dL — ABNORMAL LOW (ref 3.5–5.0)
Alkaline Phosphatase: 292 U/L — ABNORMAL HIGH (ref 38–126)
Anion gap: 10 (ref 5–15)
BUN: 21 mg/dL — ABNORMAL HIGH (ref 6–20)
CO2: 23 mmol/L (ref 22–32)
Calcium: 8.9 mg/dL (ref 8.9–10.3)
Chloride: 93 mmol/L — ABNORMAL LOW (ref 98–111)
Creatinine, Ser: 1.31 mg/dL — ABNORMAL HIGH (ref 0.61–1.24)
GFR, Estimated: >60 mL/min (ref >60)
Glucose, Bld: 104 mg/dL — ABNORMAL HIGH (ref 70–99)
Potassium: 4.7 mmol/L (ref 3.5–5.1)
Sodium: 126 mmol/L — ABNORMAL LOW (ref 135–145)
Total Bilirubin: 6.4 mg/dL — ABNORMAL HIGH (ref 0.3–1.2)
Total Protein: 7.1 g/dL (ref 6.5–8.1)

## 2021-03-05 LAB — URINALYSIS, ROUTINE W REFLEX MICROSCOPIC
Bilirubin Urine: NEGATIVE
Glucose, UA: NEGATIVE mg/dL
Hgb urine dipstick: NEGATIVE
Ketones, ur: 5 mg/dL — AB
Leukocytes,Ua: NEGATIVE
Nitrite: NEGATIVE
Protein, ur: NEGATIVE mg/dL
Specific Gravity, Urine: 1.023 (ref 1.005–1.030)
pH: 5 (ref 5.0–8.0)

## 2021-03-05 LAB — CBC
HCT: 40.6 % (ref 39.0–52.0)
Hemoglobin: 14.3 g/dL (ref 13.0–17.0)
MCH: 34.7 pg — ABNORMAL HIGH (ref 26.0–34.0)
MCHC: 35.2 g/dL (ref 30.0–36.0)
MCV: 98.5 fL (ref 80.0–100.0)
Platelets: 70 10*3/uL — ABNORMAL LOW (ref 150–400)
RBC: 4.12 MIL/uL — ABNORMAL LOW (ref 4.22–5.81)
RDW: 16.5 % — ABNORMAL HIGH (ref 11.5–15.5)
WBC: 8.4 10*3/uL (ref 4.0–10.5)
nRBC: 0 % (ref 0.0–0.2)

## 2021-03-05 LAB — OSMOLALITY: Osmolality: 287 mOsm/kg (ref 275–295)

## 2021-03-05 LAB — SARS CORONAVIRUS 2 (TAT 6-24 HRS): SARS Coronavirus 2: NEGATIVE

## 2021-03-05 LAB — TROPONIN I (HIGH SENSITIVITY)
Troponin I (High Sensitivity): 8 ng/L (ref <18)
Troponin I (High Sensitivity): 9 ng/L (ref <18)

## 2021-03-05 LAB — AMMONIA: Ammonia: 73 umol/L — ABNORMAL HIGH (ref 9–35)

## 2021-03-05 LAB — LIPID PANEL
Cholesterol: 118 mg/dL (ref 0–200)
HDL: <10 mg/dL — ABNORMAL LOW (ref >40)
Triglycerides: 105 mg/dL (ref <150)
VLDL: 21 mg/dL (ref 0–40)

## 2021-03-05 LAB — SODIUM, URINE, RANDOM: Sodium, Ur: 21 mmol/L

## 2021-03-05 LAB — OSMOLALITY, URINE: Osmolality, Ur: 734 mOsm/kg (ref 300–900)

## 2021-03-05 LAB — MAGNESIUM: Magnesium: 2 mg/dL (ref 1.7–2.4)

## 2021-03-05 MED ORDER — ALBUMIN HUMAN 25 % IV SOLN
25.0000 g | Freq: Four times a day (QID) | INTRAVENOUS | Status: DC
  Administered 2021-03-05 – 2021-03-06 (×5): 25 g via INTRAVENOUS
  Filled 2021-03-05 (×9): qty 100

## 2021-03-05 MED ORDER — FOLIC ACID 1 MG PO TABS
1.0000 mg | ORAL_TABLET | Freq: Every day | ORAL | Status: DC
  Administered 2021-03-05 – 2021-03-06 (×2): 1 mg via ORAL
  Filled 2021-03-05 (×2): qty 1

## 2021-03-05 MED ORDER — FUROSEMIDE 10 MG/ML IJ SOLN
40.0000 mg | Freq: Two times a day (BID) | INTRAMUSCULAR | Status: DC
  Administered 2021-03-05 – 2021-03-06 (×3): 40 mg via INTRAVENOUS
  Filled 2021-03-05 (×3): qty 4

## 2021-03-05 MED ORDER — ADULT MULTIVITAMIN W/MINERALS CH
1.0000 | ORAL_TABLET | Freq: Every day | ORAL | Status: DC
  Administered 2021-03-05 – 2021-03-06 (×2): 1 via ORAL
  Filled 2021-03-05 (×2): qty 1

## 2021-03-05 MED ORDER — ACETAMINOPHEN 325 MG PO TABS
650.0000 mg | ORAL_TABLET | Freq: Four times a day (QID) | ORAL | Status: DC | PRN
  Administered 2021-03-06: 650 mg via ORAL
  Filled 2021-03-05: qty 2

## 2021-03-05 MED ORDER — PANTOPRAZOLE SODIUM 40 MG PO TBEC
40.0000 mg | DELAYED_RELEASE_TABLET | Freq: Two times a day (BID) | ORAL | Status: DC
  Administered 2021-03-05 – 2021-03-06 (×3): 40 mg via ORAL
  Filled 2021-03-05 (×3): qty 1

## 2021-03-05 MED ORDER — SPIRONOLACTONE 100 MG PO TABS: 100.0000 mg | ORAL_TABLET | Freq: Every day | ORAL | Status: DC

## 2021-03-05 MED ORDER — FUROSEMIDE 20 MG PO TABS: 40.0000 mg | ORAL_TABLET | Freq: Two times a day (BID) | ORAL | Status: DC

## 2021-03-05 MED ORDER — PHYTONADIONE 5 MG PO TABS
5.0000 mg | ORAL_TABLET | Freq: Once | ORAL | Status: AC
  Administered 2021-03-05: 5 mg via ORAL
  Filled 2021-03-05: qty 1

## 2021-03-05 MED ORDER — ONDANSETRON HCL 4 MG/2ML IJ SOLN: 4.0000 mg | Freq: Four times a day (QID) | INTRAMUSCULAR | Status: DC | PRN

## 2021-03-05 MED ORDER — ACETAMINOPHEN 650 MG RE SUPP: 650.0000 mg | Freq: Four times a day (QID) | RECTAL | Status: DC | PRN

## 2021-03-05 MED ORDER — THIAMINE HCL 100 MG PO TABS: 100.0000 mg | ORAL_TABLET | Freq: Every day | ORAL | Status: DC

## 2021-03-05 MED ORDER — LACTULOSE 10 GM/15ML PO SOLN
30.0000 g | Freq: Three times a day (TID) | ORAL | Status: DC
  Administered 2021-03-05 – 2021-03-06 (×4): 30 g via ORAL
  Filled 2021-03-05 (×6): qty 45

## 2021-03-05 MED ORDER — RIFAXIMIN 550 MG PO TABS
550.0000 mg | ORAL_TABLET | Freq: Two times a day (BID) | ORAL | Status: DC
  Administered 2021-03-05 – 2021-03-06 (×3): 550 mg via ORAL
  Filled 2021-03-05 (×5): qty 1

## 2021-03-05 MED ORDER — LORAZEPAM 2 MG/ML IJ SOLN: 1.0000 mg | INTRAMUSCULAR | Status: DC | PRN

## 2021-03-05 MED ORDER — FOLIC ACID 1 MG PO TABS: 1.0000 mg | ORAL_TABLET | Freq: Every day | ORAL | Status: DC

## 2021-03-05 MED ORDER — ZINC SULFATE 220 (50 ZN) MG PO CAPS
220.0000 mg | ORAL_CAPSULE | Freq: Every day | ORAL | Status: DC
  Administered 2021-03-05 – 2021-03-06 (×2): 220 mg via ORAL
  Filled 2021-03-05 (×2): qty 1

## 2021-03-05 MED ORDER — THIAMINE HCL 100 MG/ML IJ SOLN
100.0000 mg | Freq: Every day | INTRAMUSCULAR | Status: DC
  Administered 2021-03-05 – 2021-03-06 (×2): 100 mg via INTRAVENOUS
  Filled 2021-03-05 (×2): qty 2

## 2021-03-05 MED ORDER — ALBUTEROL SULFATE (2.5 MG/3ML) 0.083% IN NEBU: 2.5000 mg | INHALATION_SOLUTION | RESPIRATORY_TRACT | Status: DC | PRN

## 2021-03-05 MED ORDER — ONDANSETRON HCL 4 MG PO TABS: 4.0000 mg | ORAL_TABLET | Freq: Four times a day (QID) | ORAL | Status: DC | PRN

## 2021-03-05 MED ORDER — LORAZEPAM 1 MG PO TABS
1.0000 mg | ORAL_TABLET | ORAL | Status: DC | PRN
  Administered 2021-03-06: 1 mg via ORAL
  Filled 2021-03-05 (×2): qty 1
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
## 2021-03-05 NOTE — ED Triage Notes (Addendum)
Patient from [place] bus stop, complaining of chest pain for the last 30 mins.  Patient states that it is shooting pain in nature.  Now also having leg pain.  Patient with cirrhosis and has not taken his lactulose for awhile.  Patient was given 324mg  ASA and one sl nitro.  The nitro brought his pain from a 6/10 to 1/10.  EKG unremarkable, NSR.
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
## 2021-03-05 NOTE — H&P (Signed)
History and Physical    Angel Webster EGB:[id number] DOB: 1961/04/01 DOA: 03/05/2021  PCP: Grayce Sessions, NP (Confirm with patient/family/NH records and if not entered, this has to be entered at [facility] point of entry) Patient coming from: Homeless  I have personally briefly reviewed patient's old medical records in [facility] Link  Chief Complaint: I feel sleepy  HPI: Angel Webster is a 60 y.o. male with medical history significant of liver cirrhosis secondary to alcohol abuse, asthma, HTN, presented with altered mentations.  Patient remains largely confused, unable to answer specific questions, most history obtained from ED physician and ED staff.  Tried to reach patient daughter over the phone but she was not picking up, and left her message.  Patient came into ED today, confused and claimed that he has been run out of all his medications for 7+ days.  He also complained about sharp-like chest pain.  Patient claimed that he moved from [place] area November 2021, he has been following with liver specialist at [facility], but cannot provide further details at this time. ED Course: Patient lethargic, blood pressure systolic 110s, no tachycardia afebrile.  Lab values are largely normal, with worsening of kidney function creatinine 1.3 compared to baseline 0.6, elevated AST and ALT and bilirubin.  Review of Systems: Unable to perform, patient lethargic and confused.   Past Medical History:  Diagnosis Date  . Asthma   . Cirrhosis (HCC)   . Hypertension     History reviewed. No pertinent surgical history.   reports that he has been smoking. He uses smokeless tobacco. He reports previous alcohol use. He reports previous drug use.  Allergies  Allergen Reactions  . Ace Inhibitors Swelling    Tongue swelling  . Other Hives    Figs cause hives    Family History  Family history unknown: Yes     Prior to Admission medications   Medication Sig Start Date End Date Taking?  Authorizing Provider  albuterol (VENTOLIN HFA) 108 (90 Base) MCG/ACT inhaler Inhale 1-2 puffs into the lungs every 4 (four) hours as needed for wheezing or shortness of breath. 07/04/20   [provider]  CONTOUR NEXT TEST test strip SMARTSIG:Via Meter 07/17/20   [provider]  folic acid (FOLVITE) 1 MG tablet Take 1 tablet (1 mg total) by mouth daily. 02/25/21   Burnadette Pop, MD  furosemide (LASIX) 40 MG tablet Take 1 tablet (40 mg total) by mouth 2 (two) times daily. 02/25/21   Burnadette Pop, MD  lactulose (CHRONULAC) 10 GM/15ML solution Take 45 mLs (30 g total) by mouth 3 (three) times daily. Hold if more than 3 loose stools a day 02/25/21   Burnadette Pop, MD  pantoprazole (PROTONIX) 40 MG tablet Take 1 tablet (40 mg total) by mouth 2 (two) times daily. 11/14/20   Grayce Sessions, NP  spironolactone (ALDACTONE) 100 MG tablet Take 1 tablet (100 mg total) by mouth daily. 02/25/21   Burnadette Pop, MD  thiamine 100 MG tablet Take 1 tablet (100 mg total) by mouth daily. 02/25/21   Burnadette Pop, MD  XIFAXAN 550 MG TABS tablet Take 1 tablet (550 mg total) by mouth 2 (two) times daily. 02/25/21 03/27/21  Burnadette Pop, MD  zinc sulfate 220 (50 Zn) MG capsule Take 1 capsule (220 mg total) by mouth daily. 01/22/21   Grayce Sessions, NP    Physical Exam: Vitals:   03/05/21 0656 03/05/21 0932 03/05/21 1114 03/05/21 1219  BP: 116/75 105/81 (!) 112/47 (!) 102/48  Pulse: 63 63 75 67  Resp: 18 16 20 14   Temp: 97.9 F (36.6 C) 97.7 F (36.5 C)    TempSrc: Oral Oral    SpO2: 96% 95% 99% 98%    Constitutional: NAD, calm, comfortable Vitals:   03/05/21 0656 03/05/21 0932 03/05/21 1114 03/05/21 1219  BP: 116/75 105/81 (!) 112/47 (!) 102/48  Pulse: 63 63 75 67  Resp: 18 16 20 14   Temp: 97.9 F (36.6 C) 97.7 F (36.5 C)    TempSrc: Oral Oral    SpO2: 96% 95% 99% 98%   Eyes: PERRL, positive jaundice ENMT: Mucous membranes are moist. Posterior pharynx clear of any  exudate or lesions.Normal dentition.  Neck: normal, supple, no masses, no thyromegaly Respiratory: clear to auscultation bilaterally, no wheezing, no crackles. Normal respiratory effort. No accessory muscle use.  Cardiovascular: Regular rate and rhythm, no murmurs / rubs / gallops. 2+ extremity edema. 2+ pedal pulses. No carotid bruits.  Abdomen: no tenderness, no masses palpated. No hepatosplenomegaly. Bowel sounds positive.  Musculoskeletal: no clubbing / cyanosis. No joint deformity upper and lower extremities. Good ROM, no contractures. Normal muscle tone.  Skin: no rashes, lesions, ulcers. No induration Neurologic: Arousable, lethargic, following simple commands psychiatric: Lethargic and confused    Labs on Admission: I have personally reviewed following labs and imaging studies  CBC: Recent Labs  Lab 03/05/21 0148  WBC 8.4  HGB 14.3  HCT 40.6  MCV 98.5  PLT 70*   Basic Metabolic Panel: Recent Labs  Lab 03/05/21 0148  NA 126*  K 4.7  CL 93*  CO2 23  GLUCOSE 104*  BUN 21*  CREATININE 1.31*  CALCIUM 8.9   GFR: CrCl cannot be calculated (Unknown ideal weight.). Liver Function Tests: Recent Labs  Lab 03/05/21 0148  AST 217*  ALT 102*  ALKPHOS 292*  BILITOT 6.4*  PROT 7.1  ALBUMIN 2.4*   No results for input(s): LIPASE, AMYLASE in the last 168 hours. Recent Labs  Lab 03/05/21 0415  AMMONIA 73*   Coagulation Profile: No results for input(s): INR, PROTIME in the last 168 hours. Cardiac Enzymes: No results for input(s): CKTOTAL, CKMB, CKMBINDEX, TROPONINI in the last 168 hours. BNP (last 3 results) No results for input(s): PROBNP in the last 8760 hours. HbA1C: No results for input(s): HGBA1C in the last 72 hours. CBG: No results for input(s): GLUCAP in the last 168 hours. Lipid Profile: No results for input(s): CHOL, HDL, LDLCALC, TRIG, CHOLHDL, LDLDIRECT in the last 72 hours. Thyroid Function Tests: No results for input(s): TSH, T4TOTAL, FREET4,  T3FREE, THYROIDAB in the last 72 hours. Anemia Panel: No results for input(s): VITAMINB12, FOLATE, FERRITIN, TIBC, IRON, RETICCTPCT in the last 72 hours. Urine analysis:    Component Value Date/Time   COLORURINE AMBER (A) 02/17/2021 0148   APPEARANCEUR HAZY (A) 02/17/2021 0148   LABSPEC 1.028 02/17/2021 0148   PHURINE 5.0 02/17/2021 0148   GLUCOSEU NEGATIVE 02/17/2021 0148   HGBUR NEGATIVE 02/17/2021 0148   BILIRUBINUR SMALL (A) 02/17/2021 0148   KETONESUR NEGATIVE 02/17/2021 0148   PROTEINUR NEGATIVE 02/17/2021 0148   NITRITE NEGATIVE 02/17/2021 0148   LEUKOCYTESUR NEGATIVE 02/17/2021 0148    Radiological Exams on Admission: DG Chest 2 View  Result Date: 03/05/2021 CLINICAL DATA:  Chest pain EXAM: CHEST - 2 VIEW COMPARISON:  02/05/2021 FINDINGS: Lungs are well expanded, symmetric, and clear. No pneumothorax or pleural effusion. Cardiac size within normal limits. Pulmonary vascularity is normal. Osseous structures are age-appropriate. No acute bone abnormality. IMPRESSION: No  active cardiopulmonary disease. Electronically Signed   By: Helyn Numbers MD   On: 03/05/2021 03:21    EKG: Independently reviewed.  Sinus and first-degree AV block.  Assessment/Plan Active Problems:   Encephalopathy  (please populate well all problems here in Problem List. (For example, if patient is on BP meds at home and you resume or decide to hold them, it is a problem that needs to be her. Same for CAD, COPD, HLD and so on)  Acute hepatic encephalopathy -Likely secondary to noncompliant with cirrhosis medications -Restart lactulose and rifaximin -H&H stable, no symptoms signs of GI bleed, monitor off antibiotics.  AKI -With signs of fluid overload, suspect hepatorenal syndrome -Aggressive diuresis, change p.o. Lasix to IV Lasix 40 mg twice daily -Hold Aldactone for today, consider restart once creatinine level stabilized. -Check renal ultrasound  Acute on chronic alcoholic hepatitis -Madrey's  discriminant factor=24.8, no steroid indicated -CIWA protocol  Hyponatremia -Hypervolemic -Suspect diluted from fluid retention, from noncompliant with diuresis -Check hyponatremia work-up, fluid restriction  Hypoalbuminemia -We will give 2 days worth of IV albumin to boost Lasix effect.  Thrombocytopenia -Secondary to cirrhosis, along with INR elevation, will hold off chemical DVT prophylaxis.  Chest pain -Resolved, ACS ruled out. -Check Echo -Outpatient stress test -Lipid panel  Peripheral edema -Fluid overload, DVT study was performed 12 days ago on last admission, will not repeat this time.  Increase Lasix as above.  Decompensated liver cirrhosis -Suspect ongoing alcohol use, and noncompliant with cirrhosis medications -Signs of encephalopathy, hypoalbuminemia, and also possible coagulopathy -Check INR, ordered 1 dose of p.o. vitamin K, repeat INR tomorrow.  Asthma -Stable, no acute concern  Social issue/homeless/noncompliant with medications -Consult case manager  Morbid obesity -Outpatient PCP follow-up  DVT prophylaxis: Foot pump Code Status: Full code Family Communication: Left daughter message Disposition Plan: Expect more than 2 midnight hospital stay to treat decompensated liver cirrhosis Consults called: None Admission status: Tele admit   Emeline General MD [facility] Pager [phone]  03/05/2021, 12:47 PM

## 2021-03-05 NOTE — ED Notes (Signed)
Pt resting at this time. VS WNL. Visible chest rise and fall noted. Pt appears in NAD.  

## 2021-03-05 NOTE — Discharge Planning (Signed)
RNCM consulted in regards to medication assistance.  Pt has insurance coverage and is not eligible for Medication Assistance Through [facility]) program.  RNCM suggests sending Rx to [facility] pharmacy to fill and TOC will help with co-pays.  Plan is for pt admission- TOC team will continue to follow.

## 2021-03-05 NOTE — ED Provider Notes (Signed)
1   Gwyneth Sprout, MD 03/08/21 [phone]

## 2021-03-06 ENCOUNTER — Other Ambulatory Visit (HOSPITAL_COMMUNITY): Payer: Self-pay

## 2021-03-06 ENCOUNTER — Inpatient Hospital Stay (HOSPITAL_COMMUNITY): Payer: Medicaid Other

## 2021-03-06 DIAGNOSIS — K7030 Alcoholic cirrhosis of liver without ascites: Secondary | ICD-10-CM

## 2021-03-06 DIAGNOSIS — R079 Chest pain, unspecified: Secondary | ICD-10-CM

## 2021-03-06 DIAGNOSIS — E860 Dehydration: Secondary | ICD-10-CM

## 2021-03-06 DIAGNOSIS — N179 Acute kidney failure, unspecified: Secondary | ICD-10-CM

## 2021-03-06 DIAGNOSIS — I10 Essential (primary) hypertension: Secondary | ICD-10-CM

## 2021-03-06 LAB — ECHOCARDIOGRAM COMPLETE
AR max vel: 2.53 cm2
AV Area VTI: 2.76 cm2
AV Area mean vel: 2.46 cm2
AV Mean grad: 5 mmHg
AV Peak grad: 9 mmHg
Ao pk vel: 1.5 m/s
Area-P 1/2: 3.77 cm2
S' Lateral: 3.2 cm

## 2021-03-06 LAB — COMPREHENSIVE METABOLIC PANEL
ALT: 80 U/L — ABNORMAL HIGH (ref 0–44)
AST: 164 U/L — ABNORMAL HIGH (ref 15–41)
Albumin: 2.7 g/dL — ABNORMAL LOW (ref 3.5–5.0)
Alkaline Phosphatase: 198 U/L — ABNORMAL HIGH (ref 38–126)
Anion gap: 9 (ref 5–15)
BUN: 14 mg/dL (ref 6–20)
CO2: 27 mmol/L (ref 22–32)
Calcium: 9 mg/dL (ref 8.9–10.3)
Chloride: 99 mmol/L (ref 98–111)
Creatinine, Ser: 1.09 mg/dL (ref 0.61–1.24)
GFR, Estimated: >60 mL/min (ref >60)
Glucose, Bld: 117 mg/dL — ABNORMAL HIGH (ref 70–99)
Potassium: 4.3 mmol/L (ref 3.5–5.1)
Sodium: 135 mmol/L (ref 135–145)
Total Bilirubin: 6.8 mg/dL — ABNORMAL HIGH (ref 0.3–1.2)
Total Protein: 6.4 g/dL — ABNORMAL LOW (ref 6.5–8.1)

## 2021-03-06 LAB — CBC
HCT: 33.2 % — ABNORMAL LOW (ref 39.0–52.0)
Hemoglobin: 12 g/dL — ABNORMAL LOW (ref 13.0–17.0)
MCH: 35.1 pg — ABNORMAL HIGH (ref 26.0–34.0)
MCHC: 36.1 g/dL — ABNORMAL HIGH (ref 30.0–36.0)
MCV: 97.1 fL (ref 80.0–100.0)
Platelets: 75 10*3/uL — ABNORMAL LOW (ref 150–400)
RBC: 3.42 MIL/uL — ABNORMAL LOW (ref 4.22–5.81)
RDW: 17.1 % — ABNORMAL HIGH (ref 11.5–15.5)
WBC: 7.4 10*3/uL (ref 4.0–10.5)
nRBC: 0 % (ref 0.0–0.2)

## 2021-03-06 LAB — PROTIME-INR
INR: 1.7 — ABNORMAL HIGH (ref 0.8–1.2)
Prothrombin Time: 20.1 seconds — ABNORMAL HIGH (ref 11.4–15.2)

## 2021-03-06 MED ORDER — LACTULOSE ENCEPHALOPATHY 10 GM/15ML PO SOLN
30.0000 g | Freq: Three times a day (TID) | ORAL | 1 refills | Status: AC
  Filled 2021-03-06: qty 1892, 14d supply, fill #0

## 2021-03-06 NOTE — Discharge Summary (Signed)
Physician Discharge Summary  Deontay Ladnier WUJ:[id number] DOB: 01-26-1961 DOA: 03/05/2021  PCP: Grayce Sessions, NP  Admit date: 03/05/2021 Discharge date: 03/06/2021  Admitted From: homeless  Disposition:  homeless   Recommendations for Outpatient Follow-up:  1. F/u on Bmet on next visit   Discharge Condition:  stable   CODE STATUS:  Full code   Diet recommendation:  Heart healthy Consultations:  none  Procedures/Studies: . none   Discharge Diagnoses:  Principal Problem:   Chest pain Active Problems:   Thrombocytopenia (HCC)   Alcoholic cirrhosis (HCC)   Homelessness    Brief Summary: Angel Webster is a 60 y/o with Cirrhosis due to alcohol abuse, asthma, HTN presents from the [place] for chest pain for 30 min described as shooting pain.He also had b/l leg pain. He was given Nitro and ASA and pain improved. He was admitted from 5/21-5/30 for acute hepatic encephalopathy and had not taken any lactulose since dc.  Noted to have a sodium of 126, Cr 1.31, elevated LFTs.   Hospital Course:  Chest pain - sharp pain, has resolved - troponin 8 and 9 - 2 D ECHO reveals grade 1 d CHF  Hyponatremia , fluid overload - with Lasix and Albumin, sodium has improved to 135 and Cr has improved to 1.09  -cont outpt Lasix and Aldactone  Elevated LFTs - likely has ongoing alcohol use- LFTs have improved today  Ammonia level 73- suspected to have hepatic encephalopathy - He states he was given 2 bottle of Protonix as the pharmacy when he last went to pick up his meds and he never received any Lactulose - I have prescribed Lactulose through our pharmacy and he should receive it prior to leaving the hospital  Cirrhosis, Thrombocytopenia, elevated INR - advised to abstain from alcohol   Discharge Exam: Vitals:   03/06/21 1054 03/06/21 1525  BP: 118/74 119/62  Pulse: 73 77  Resp: 14 15  Temp: 97.8 F (36.6 C) 97.6 F (36.4 C)  SpO2: 98% 96%   Vitals:   03/06/21 0522  03/06/21 0722 03/06/21 1054 03/06/21 1525  BP: 121/69 114/68 118/74 119/62  Pulse: 89 83 73 77  Resp: Temp: 97.9 F (36.6 C) 97.8 F (36.6 C) 97.8 F (36.6 C) 97.6 F (36.4 C)  TempSrc: Oral Axillary Oral Oral  SpO2: 96% 93% 98% 96%    General: Pt is alert, awake, not in acute distress Cardiovascular: RRR, S1/S2 +, no rubs, no gallops Respiratory: CTA bilaterally, no wheezing, no rhonchi Abdominal: Soft, NT, ND, bowel sounds + Extremities: no edema, no cyanosis   Discharge Instructions  Discharge Instructions    Diet - low sodium heart healthy   Complete by: As directed      Allergies as of 03/06/2021      Reactions   Ace Inhibitors Swelling, Other (See Comments)   Tongue swelling   Other Hives, Other (See Comments)   Figs cause hives      Medication List    TAKE these medications   albuterol 108 (90 Base) MCG/ACT inhaler Commonly known as: VENTOLIN HFA Inhale 1-2 puffs into the lungs every 4 (four) hours as needed for wheezing or shortness of breath.   carvedilol 3.125 MG tablet Commonly known as: COREG Take 3.125 mg by mouth 2 (two) times daily.   Contour Next Test test strip Generic drug: glucose blood SMARTSIG:Via Meter   folic acid 1 MG tablet Commonly known as: FOLVITE Take 1 tablet (1 mg total) by  mouth daily.   furosemide 40 MG tablet Commonly known as: LASIX Take 1 tablet (40 mg total) by mouth 2 (two) times daily.   lactulose (encephalopathy) 10 GM/15ML Soln Commonly known as: CHRONULAC Take 45 mLs (30 g total) by mouth 3 (three) times daily. Hold if more than 3 loose stools in a day What changed: additional instructions   pantoprazole 40 MG tablet Commonly known as: PROTONIX Take 1 tablet (40 mg total) by mouth 2 (two) times daily.   spironolactone 100 MG tablet Commonly known as: ALDACTONE Take 1 tablet (100 mg total) by mouth daily.   thiamine 100 MG tablet Take 1 tablet (100 mg total) by mouth daily.   Xifaxan 550 MG  Tabs tablet Generic drug: rifaximin Take 1 tablet (550 mg total) by mouth 2 (two) times daily.   zinc sulfate 220 (50 Zn) MG capsule Take 1 capsule (220 mg total) by mouth daily.       Allergies  Allergen Reactions  . Ace Inhibitors Swelling and Other (See Comments)    Tongue swelling  . Other Hives and Other (See Comments)    Figs cause hives      DG Chest 2 View  Result Date: 03/05/2021 CLINICAL DATA:  Chest pain EXAM: CHEST - 2 VIEW COMPARISON:  02/05/2021 FINDINGS: Lungs are well expanded, symmetric, and clear. No pneumothorax or pleural effusion. Cardiac size within normal limits. Pulmonary vascularity is normal. Osseous structures are age-appropriate. No acute bone abnormality. IMPRESSION: No active cardiopulmonary disease. Electronically Signed   By: Helyn Numbers MD   On: 03/05/2021 03:21   US RENAL  Result Date: 03/05/2021 CLINICAL DATA:  Acute renal insufficiency. EXAM: RENAL / URINARY TRACT ULTRASOUND COMPLETE COMPARISON:  No recent prior. FINDINGS: Right Kidney: Renal measurements: 11.9 x 6.3 x 4.9 cm = volume: 195 mL. Echogenicity within normal limits. No mass or hydronephrosis visualized. 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 Left Kidney: Renal measurements: 12.9 x 7.8 x 7.0 cm = volume: 366.6 mL. Echogenicity within normal limits. No mass or hydronephrosis visualized. Bladder: Bladder nondistended. Other: Gallstone incidentally noted. Limited exam due to patient's body habitus. IMPRESSION: 1. 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 No acute or focal renal abnormality. No hydronephrosis. Bladder nondistended. 2.  Gallstone incidentally noted. Electronically Signed   By: Maisie Fus  Register   On: 03/05/2021 13:58   DG Chest Port 1 View  Result Date: 02/05/2021 CLINICAL DATA:  Shortness of breath. EXAM: PORTABLE CHEST 1 VIEW COMPARISON:  Chest x-ray 11/01/2020 FINDINGS: The heart size and mediastinal contours are unchanged. Slightly prominent hilar  vasculature as prior. No focal consolidation. No pulmonary edema. No pleural effusion. No pneumothorax. No acute osseous abnormality. IMPRESSION: No active disease. Electronically Signed   By: Tish Frederickson M.D.   On: 02/05/2021 05:58   DG Knee Complete 4 Views Left  Result Date: 02/17/2021 CLINICAL DATA:  Knee pain. EXAM: LEFT KNEE - COMPLETE 4+ VIEW COMPARISON:  None. FINDINGS: No evidence of fracture, dislocation, or joint effusion. Enthesopathy of the patella superiorly and inferiorly. Nonaggressive appearing periosteal reaction along the proximal tibia. Mild tibiofemoral joint space narrowing. No aggressive appearing focal bone abnormality. Soft tissues are unremarkable. IMPRESSION: No acute displaced fracture or dislocation. Electronically Signed   By: Tish Frederickson M.D.   On: 02/17/2021 00:49   ECHOCARDIOGRAM COMPLETE  Result Date: 03/06/2021    ECHOCARDIOGRAM REPORT   Patient Name:   DEWARREN LEDBETTER Date of Exam: 03/06/2021 Medical Rec #:  [id number]     Height:  69.0 in Accession #:    [id number]    Weight:       275.0 lb Date of Birth:  Apr 22, 1961    BSA:          2.365 m Patient Age:    59 years      BP:           118/74 mmHg Patient Gender: M             HR:           82 bpm. Exam Location:  Inpatient Procedure: 2D Echo, Cardiac Doppler and Color Doppler Indications:    R07.9* Chest pain, unspecified  History:        Patient has no prior history of Echocardiogram examinations.                 Risk Factors:Morbid obesity. Liver disease.  Sonographer:    Roosvelt Maser RDCS Referring Phys: [id number] Emeline General IMPRESSIONS  1. Left ventricular ejection fraction, by estimation, is 60 to 65%. The left ventricle has normal function. The left ventricle has no regional wall motion abnormalities. Left ventricular diastolic parameters are consistent with Grade I diastolic dysfunction (impaired relaxation).  2. Right ventricular systolic function is normal. The right ventricular size is mildly enlarged.   3. The mitral valve is normal in structure. No evidence of mitral valve regurgitation. No evidence of mitral stenosis.  4. The aortic valve is normal in structure. Aortic valve regurgitation is not visualized. No aortic stenosis is present.  5. The inferior vena cava is normal in size with greater than 50% respiratory variability, suggesting right atrial pressure of 3 mmHg. FINDINGS  Left Ventricle: Left ventricular ejection fraction, by estimation, is 60 to 65%. The left ventricle has normal function. The left ventricle has no regional wall motion abnormalities. The left ventricular internal cavity size was normal in size. There is  no left ventricular hypertrophy. Left ventricular diastolic parameters are consistent with Grade I diastolic dysfunction (impaired relaxation). Right Ventricle: The right ventricular size is mildly enlarged. No increase in right ventricular wall thickness. Right ventricular systolic function is normal. Left Atrium: Left atrial size was normal in size. Right Atrium: Right atrial size was normal in size. Pericardium: There is no evidence of pericardial effusion. Mitral Valve: The mitral valve is normal in structure. No evidence of mitral valve regurgitation. No evidence of mitral valve stenosis. Tricuspid Valve: The tricuspid valve is normal in structure. Tricuspid valve regurgitation is not demonstrated. No evidence of tricuspid stenosis. Aortic Valve: The aortic valve is normal in structure. Aortic valve regurgitation is not visualized. No aortic stenosis is present. Aortic valve mean gradient measures 5.0 mmHg. Aortic valve peak gradient measures 9.0 mmHg. Aortic valve area, by VTI measures 2.76 cm. Pulmonic Valve: The pulmonic valve was normal in structure. Pulmonic valve regurgitation is not visualized. No evidence of pulmonic stenosis. Aorta: The aortic root is normal in size and structure. Venous: The inferior vena cava is normal in size with greater than 50% respiratory  variability, suggesting right atrial pressure of 3 mmHg. IAS/Shunts: No atrial level shunt detected by color flow Doppler.  LEFT VENTRICLE PLAX 2D LVIDd:         4.50 cm  Diastology LVIDs:         3.20 cm  LV e' medial:    10.00 cm/s LV PW:         1.10 cm  LV E/e' medial:  7.1 LV IVS:  1.10 cm  LV e' lateral:   11.50 cm/s LVOT diam:     2.00 cm  LV E/e' lateral: 6.1 LV SV:         84 LV SV Index:   35 LVOT Area:     3.14 cm  RIGHT VENTRICLE RV Basal diam:  4.10 cm RV Mid diam:    4.10 cm LEFT ATRIUM             Index       RIGHT ATRIUM           Index LA diam:        3.50 cm 1.48 cm/m  RA Area:     20.50 cm LA Vol (A2C):   70.5 ml 29.80 ml/m RA Volume:   57.70 ml  24.39 ml/m LA Vol (A4C):   62.3 ml 26.34 ml/m LA Biplane Vol: 70.5 ml 29.80 ml/m  AORTIC VALVE AV Area (Vmax):    2.53 cm AV Area (Vmean):   2.46 cm AV Area (VTI):     2.76 cm AV Vmax:           150.00 cm/s AV Vmean:          111.000 cm/s AV VTI:            0.304 m AV Peak Grad:      9.0 mmHg AV Mean Grad:      5.0 mmHg LVOT Vmax:         121.00 cm/s LVOT Vmean:        87.000 cm/s LVOT VTI:          0.267 m LVOT/AV VTI ratio: 0.88  AORTA Ao Root diam: 3.00 cm MITRAL VALVE MV Area (PHT): 3.77 cm    SHUNTS MV Decel Time: 201 msec    Systemic VTI:  0.27 m MV E velocity: 70.70 cm/s  Systemic Diam: 2.00 cm MV A velocity: 86.10 cm/s MV E/A ratio:  0.82 Donato SchultzMark Skains MD Electronically signed by Donato SchultzMark Skains MD Signature Date/Time: 03/06/2021/1:06:21 PM    Final    US SCROTUM W/DOPPLER  Result Date: 02/17/2021 CLINICAL DATA:  60 year old with right testicular pain. EXAM: SCROTAL ULTRASOUND DOPPLER ULTRASOUND OF THE TESTICLES TECHNIQUE: Complete ultrasound examination of the testicles, epididymis, and other scrotal structures was performed. Color and spectral Doppler ultrasound were also utilized to evaluate blood flow to the testicles. COMPARISON:  None. FINDINGS: Right testicle Measurements: 4.2 x 2.5 x 2.7 cm. Homogeneous echogenicity. Normal  blood flow. No mass or microlithiasis visualized. Left testicle Measurements: 4.0 x 2.4 x 2.6 cm. Homogeneous echogenicity. Normal blood flow. No mass or microlithiasis visualized. Right epididymis:  Normal in size and appearance. Left epididymis:  Normal in size and appearance. Hydrocele:  None visualized. Varicocele:  None visualized. Pulsed Doppler interrogation of both testes demonstrates normal low resistance arterial and venous waveforms bilaterally. Other: Technically challenging exam due to patient movement and medical condition. IMPRESSION: Unremarkable scrotal ultrasound. Electronically Signed   By: Narda RutherfordMelanie  Sanford M.D.   On: 02/17/2021 03:30   VAS US LOWER EXTREMITY VENOUS (DVT)  Result Date: 02/20/2021  Lower Venous DVT Study Patient Name:  Lucrezia EuropeWILLIAM Signer  Date of Exam:   02/20/2021 Medical Rec #: [id number]      Accession #:    [id number] Date of Birth: 1961-02-11     Patient Gender: M Patient Age:   59Y [facility] Procedure:      VAS US LOWER EXTREMITY VENOUS (DVT) Referring Phys: [id number] [facility]  [facility]U --------------------------------------------------------------------------------  Indications: Edema.  Risk Factors: None identified. Limitations: Body habitus and poor ultrasound/tissue interface. Comparison Study: No prior studies. Performing Technologist: Chanda Busing RVT  Examination Guidelines: A complete evaluation includes B-mode imaging, spectral Doppler, color Doppler, and power Doppler as needed of all accessible portions of each vessel. Bilateral testing is considered an integral part of a complete examination. Limited examinations for reoccurring indications may be performed as noted. The reflux portion of the exam is performed with the patient in reverse Trendelenburg.  +---------+---------------+---------+-----------+----------+--------------+ RIGHT    CompressibilityPhasicitySpontaneityPropertiesThrombus Aging  +---------+---------------+---------+-----------+----------+--------------+ CFV      Full           Yes      Yes                                 +---------+---------------+---------+-----------+----------+--------------+ SFJ      Full                                                        +---------+---------------+---------+-----------+----------+--------------+ FV Prox  Full                                                        +---------+---------------+---------+-----------+----------+--------------+ FV Mid   Full                                                        +---------+---------------+---------+-----------+----------+--------------+ FV DistalFull                                                        +---------+---------------+---------+-----------+----------+--------------+ PFV      Full                                                        +---------+---------------+---------+-----------+----------+--------------+ POP      Full           Yes      Yes                                 +---------+---------------+---------+-----------+----------+--------------+ PTV      Full                                                        +---------+---------------+---------+-----------+----------+--------------+ PERO     Full                                                        +---------+---------------+---------+-----------+----------+--------------+   +---------+---------------+---------+-----------+----------+--------------+  LEFT     CompressibilityPhasicitySpontaneityPropertiesThrombus Aging +---------+---------------+---------+-----------+----------+--------------+ CFV      Full           Yes      Yes                                 +---------+---------------+---------+-----------+----------+--------------+ SFJ      Full                                                         +---------+---------------+---------+-----------+----------+--------------+ FV Prox  Full                                                        +---------+---------------+---------+-----------+----------+--------------+ FV Mid   Full                                                        +---------+---------------+---------+-----------+----------+--------------+ FV DistalFull                                                        +---------+---------------+---------+-----------+----------+--------------+ PFV      Full                                                        +---------+---------------+---------+-----------+----------+--------------+ POP      Full           Yes      Yes                                 +---------+---------------+---------+-----------+----------+--------------+ PTV      Full                                                        +---------+---------------+---------+-----------+----------+--------------+ PERO     Full                                                        +---------+---------------+---------+-----------+----------+--------------+     Summary: RIGHT: - There is no evidence of deep vein thrombosis in the lower extremity. However, portions of this examination were limited- see technologist comments above.  - No cystic structure found in the popliteal fossa.  LEFT: - There is no evidence  of deep vein thrombosis in the lower extremity. However, portions of this examination were limited- see technologist comments above.  - No cystic structure found in the popliteal fossa.  *See table(s) above for measurements and observations. Electronically signed by Coral Else MD on 02/20/2021 at 2:01:12 PM.    Final    US Abdomen Limited RUQ (LIVER/GB)  Result Date: 02/18/2021 CLINICAL DATA:  Elevated liver enzymes. EXAM: ULTRASOUND ABDOMEN LIMITED RIGHT UPPER QUADRANT COMPARISON:  None. FINDINGS: Gallbladder: Calcified gallstones within  the gallbladder lumen. Gallbladder wall thickening. No pericholecystic fluid visualized. No sonographic Murphy sign noted by sonographer. Common bile duct: Diameter: 4 mm. Liver: Nodular hepatic contour. No focal lesion identified. Coarsened and decrease parenchymal echogenicity. Portal vein is patent on color Doppler imaging with normal direction of blood flow towards the liver. Other: Tips is noted and is patent. IMPRESSION: 1. Cholelithiasis with associated gallbladder wall thickening which can be seen in the setting of chronic liver disease versus acute cholecystitis. Findings equivocal for acute cholecystitis with no pericholecystic fluid or sonographic Murphy sign recorded. Correlate clinically. 2. Cirrhosis with patent TIPS. Consider nonemergent MRI liver protocol further evaluation. Electronically Signed   By: Tish Frederickson M.D.   On: 02/18/2021 02:42      The results of significant diagnostics from this hospitalization (including imaging, microbiology, ancillary and laboratory) are listed below for reference.     Microbiology: Recent Results (from the past 240 hour(s))  SARS CORONAVIRUS 2 (TAT 6-24 HRS) Nasopharyngeal Nasopharyngeal Swab     Status: None   Collection Time: 03/05/21  2:05 PM   Specimen: Nasopharyngeal Swab  Result Value Ref Range Status   SARS Coronavirus 2 NEGATIVE NEGATIVE Final    Comment: (NOTE) SARS-CoV-2 target nucleic acids are NOT DETECTED.  The SARS-CoV-2 RNA is generally detectable in upper and lower respiratory specimens during the acute phase of infection. Negative results do not preclude SARS-CoV-2 infection, do not rule out co-infections with other pathogens, and should not be used as the sole basis for treatment or other patient management decisions. Negative results must be combined with clinical observations, patient history, and epidemiological information. The expected result is Negative.  Fact Sheet for  Patients: HairSlick.no  Fact Sheet for Healthcare Providers: quierodirigir.com  This test is not yet approved or cleared by the [place] FDA and  has been authorized for detection and/or diagnosis of SARS-CoV-2 by FDA under an Emergency Use Authorization (EUA). This EUA will remain  in effect (meaning this test can be used) for the duration of the COVID-19 declaration under Se ction 564(b)(1) of the Act, 21 U.S.C. section 360bbb-3(b)(1), unless the authorization is terminated or revoked sooner.  Performed at [facility], [address], [place], [place] [address]      Labs: BNP (last 3 results) Recent Labs    02/05/21 0530  BNP 21.2   Basic Metabolic Panel: Recent Labs  Lab 03/05/21 0148 03/05/21 1604 03/06/21 0623  NA 126*  --  135  K 4.7  --  4.3  CL 93*  --  99  CO2 23  --  27  GLUCOSE 104*  --  117*  BUN 21*  --  14  CREATININE 1.31*  --  1.09  CALCIUM 8.9  --  9.0  MG  --  2.0  --    Liver Function Tests: Recent Labs  Lab 03/05/21 0148 03/06/21 0623  AST 217* 164*  ALT 102* 80*  ALKPHOS 292* 198*  BILITOT 6.4* 6.8*  PROT  7.1 6.4*  ALBUMIN 2.4* 2.7*   No results for input(s): LIPASE, AMYLASE in the last 168 hours. Recent Labs  Lab 03/05/21 0415  AMMONIA 73*   CBC: Recent Labs  Lab 03/05/21 0148 03/06/21 0623  WBC 8.4 7.4  HGB 14.3 12.0*  HCT 40.6 33.2*  MCV 98.5 97.1  PLT 70* 75*   Cardiac Enzymes: No results for input(s): CKTOTAL, CKMB, CKMBINDEX, TROPONINI in the last 168 hours. BNP: Invalid input(s): POCBNP CBG: No results for input(s): GLUCAP in the last 168 hours. D-Dimer No results for input(s): DDIMER in the last 72 hours. Hgb A1c No results for input(s): HGBA1C in the last 72 hours. Lipid Profile Recent Labs    03/05/21 1403  CHOL 118  HDL <10*  LDLCALC NOT CALCULATED  TRIG 105  CHOLHDL NOT CALCULATED   Thyroid function studies No results for  input(s): TSH, T4TOTAL, T3FREE, THYROIDAB in the last 72 hours.  Invalid input(s): FREET3 Anemia work up No results for input(s): VITAMINB12, FOLATE, FERRITIN, TIBC, IRON, RETICCTPCT in the last 72 hours. Urinalysis    Component Value Date/Time   COLORURINE AMBER (A) 03/05/2021 1403   APPEARANCEUR CLEAR 03/05/2021 1403   LABSPEC 1.023 03/05/2021 1403   PHURINE 5.0 03/05/2021 1403   GLUCOSEU NEGATIVE 03/05/2021 1403   HGBUR NEGATIVE 03/05/2021 1403   BILIRUBINUR NEGATIVE 03/05/2021 1403   KETONESUR 5 (A) 03/05/2021 1403   PROTEINUR NEGATIVE 03/05/2021 1403   NITRITE NEGATIVE 03/05/2021 1403   LEUKOCYTESUR NEGATIVE 03/05/2021 1403   Sepsis Labs Invalid input(s): PROCALCITONIN,  WBC,  LACTICIDVEN Microbiology Recent Results (from the past 240 hour(s))  SARS CORONAVIRUS 2 (TAT 6-24 HRS) Nasopharyngeal Nasopharyngeal Swab     Status: None   Collection Time: 03/05/21  2:05 PM   Specimen: Nasopharyngeal Swab  Result Value Ref Range Status   SARS Coronavirus 2 NEGATIVE NEGATIVE Final    Comment: (NOTE) SARS-CoV-2 target nucleic acids are NOT DETECTED.  The SARS-CoV-2 RNA is generally detectable in upper and lower respiratory specimens during the acute phase of infection. Negative results do not preclude SARS-CoV-2 infection, do not rule out co-infections with other pathogens, and should not be used as the sole basis for treatment or other patient management decisions. Negative results must be combined with clinical observations, patient history, and epidemiological information. The expected result is Negative.  Fact Sheet for Patients: HairSlick.no  Fact Sheet for Healthcare Providers: quierodirigir.com  This test is not yet approved or cleared by the [place] FDA and  has been authorized for detection and/or diagnosis of SARS-CoV-2 by FDA under an Emergency Use Authorization (EUA). This EUA will remain  in effect  (meaning this test can be used) for the duration of the COVID-19 declaration under Se ction 564(b)(1) of the Act, 21 U.S.C. section 360bbb-3(b)(1), unless the authorization is terminated or revoked sooner.  Performed at [facility], [address], [place], [place] [address]      Time coordinating discharge in minutes: 65  SIGNED:   Calvert Cantor, MD  [facility] 03/06/2021, 5:38 PM

## 2021-03-06 NOTE — Progress Notes (Signed)
  Echocardiogram 2D Echocardiogram has been performed.  Angel Webster F 03/06/2021, 11:42 AM

## 2021-03-06 NOTE — Progress Notes (Signed)
Pt discharged by MD around 1630. TOC medications brought to bedside. Pt has belongings in bag. On-call Child psychotherapist provided homeless resources and bus pass for pt. Pt escorted out in wheelchair with belongings and medication.   Robina Ade, RN

## 2021-03-06 NOTE — Social Work (Signed)
Homeless resources added to discharge instructions.   Bus pass provided for d/c

## 2021-03-06 NOTE — Discharge Instructions (Signed)
[facility] [place]   [facility] [phone] Call for shelter availability.  You must have access to a phone so they can call you back.  [facility] [address] [place] [phone] M-F 8:00-3:00; S-S 8:00-2:00  [facility] (Men and Women) [address] [place] [phone]  [facility] (Men and Women) [address]  [phone]  [facility] (Dom[facility] Violence [facility]) [address] [place] [phone]  [facility] (Men) [address] [place] [phone]  [facility] (Single women and women with children) [address] [place] [phone]      

## 2021-03-06 NOTE — ED Notes (Signed)
Pt placed on 2L [place] due to O2 sats 88% while sleeping

## 2021-03-07 ENCOUNTER — Emergency Department (HOSPITAL_COMMUNITY)
Admission: EM | Admit: 2021-03-07 | Discharge: 2021-03-07 | Disposition: A | Discharge status: Home / Self Care | Payer: Medicaid Other | Attending: Emergency Medicine | Admitting: Emergency Medicine

## 2021-03-07 ENCOUNTER — Telehealth: Payer: Self-pay

## 2021-03-07 ENCOUNTER — Emergency Department (HOSPITAL_COMMUNITY): Payer: Medicaid Other

## 2021-03-07 ENCOUNTER — Other Ambulatory Visit: Payer: Self-pay

## 2021-03-07 DIAGNOSIS — Z79899 Other long term (current) drug therapy: Secondary | ICD-10-CM | POA: Diagnosis not present

## 2021-03-07 DIAGNOSIS — R410 Disorientation, unspecified: Secondary | ICD-10-CM | POA: Diagnosis not present

## 2021-03-07 DIAGNOSIS — R609 Edema, unspecified: Secondary | ICD-10-CM | POA: Insufficient documentation

## 2021-03-07 DIAGNOSIS — M79606 Pain in leg, unspecified: Secondary | ICD-10-CM | POA: Diagnosis present

## 2021-03-07 DIAGNOSIS — Z299 Encounter for prophylactic measures, unspecified: Secondary | ICD-10-CM | POA: Diagnosis not present

## 2021-03-07 DIAGNOSIS — M7989 Other specified soft tissue disorders: Secondary | ICD-10-CM

## 2021-03-07 DIAGNOSIS — R0602 Shortness of breath: Secondary | ICD-10-CM | POA: Insufficient documentation

## 2021-03-07 DIAGNOSIS — Z20822 Contact with and (suspected) exposure to covid-19: Secondary | ICD-10-CM | POA: Diagnosis not present

## 2021-03-07 DIAGNOSIS — I10 Essential (primary) hypertension: Secondary | ICD-10-CM | POA: Insufficient documentation

## 2021-03-07 DIAGNOSIS — F17200 Nicotine dependence, unspecified, uncomplicated: Secondary | ICD-10-CM | POA: Insufficient documentation

## 2021-03-07 DIAGNOSIS — J45909 Unspecified asthma, uncomplicated: Secondary | ICD-10-CM | POA: Insufficient documentation

## 2021-03-07 DIAGNOSIS — R2240 Localized swelling, mass and lump, unspecified lower limb: Secondary | ICD-10-CM | POA: Diagnosis not present

## 2021-03-07 LAB — COMPREHENSIVE METABOLIC PANEL
ALT: 94 U/L — ABNORMAL HIGH (ref 0–44)
AST: 182 U/L — ABNORMAL HIGH (ref 15–41)
Albumin: 3.2 g/dL — ABNORMAL LOW (ref 3.5–5.0)
Alkaline Phosphatase: 244 U/L — ABNORMAL HIGH (ref 38–126)
Anion gap: 7 (ref 5–15)
BUN: 11 mg/dL (ref 6–20)
CO2: 30 mmol/L (ref 22–32)
Calcium: 9.6 mg/dL (ref 8.9–10.3)
Chloride: 98 mmol/L (ref 98–111)
Creatinine, Ser: 1.03 mg/dL (ref 0.61–1.24)
GFR, Estimated: >60 mL/min (ref >60)
Glucose, Bld: 106 mg/dL — ABNORMAL HIGH (ref 70–99)
Potassium: 4 mmol/L (ref 3.5–5.1)
Sodium: 135 mmol/L (ref 135–145)
Total Bilirubin: 7.5 mg/dL — ABNORMAL HIGH (ref 0.3–1.2)
Total Protein: 6.9 g/dL (ref 6.5–8.1)

## 2021-03-07 LAB — CBC WITH DIFFERENTIAL/PLATELET
Abs Immature Granulocytes: 0.03 10*3/uL (ref 0.00–0.07)
Basophils Absolute: 0.1 10*3/uL (ref 0.0–0.1)
Basophils Relative: 1 %
Eosinophils Absolute: 0.2 10*3/uL (ref 0.0–0.5)
Eosinophils Relative: 3 %
HCT: 37.6 % — ABNORMAL LOW (ref 39.0–52.0)
Hemoglobin: 13 g/dL (ref 13.0–17.0)
Immature Granulocytes: 0 %
Lymphocytes Relative: 19 %
Lymphs Abs: 1.4 10*3/uL (ref 0.7–4.0)
MCH: 35.1 pg — ABNORMAL HIGH (ref 26.0–34.0)
MCHC: 34.6 g/dL (ref 30.0–36.0)
MCV: 101.6 fL — ABNORMAL HIGH (ref 80.0–100.0)
Monocytes Absolute: 0.9 10*3/uL (ref 0.1–1.0)
Monocytes Relative: 12 %
Neutro Abs: 4.8 10*3/uL (ref 1.7–7.7)
Neutrophils Relative %: 65 %
Platelets: 70 10*3/uL — ABNORMAL LOW (ref 150–400)
RBC: 3.7 MIL/uL — ABNORMAL LOW (ref 4.22–5.81)
RDW: 17.5 % — ABNORMAL HIGH (ref 11.5–15.5)
WBC: 7.4 10*3/uL (ref 4.0–10.5)
nRBC: 0 % (ref 0.0–0.2)

## 2021-03-07 LAB — TROPONIN I (HIGH SENSITIVITY)
Troponin I (High Sensitivity): 7 ng/L (ref <18)
Troponin I (High Sensitivity): 7 ng/L (ref <18)

## 2021-03-07 LAB — AMMONIA: Ammonia: 32 umol/L (ref 9–35)

## 2021-03-07 LAB — BRAIN NATRIURETIC PEPTIDE: B Natriuretic Peptide: 150.6 pg/mL — ABNORMAL HIGH (ref 0.0–100.0)
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
## 2021-03-07 NOTE — Telephone Encounter (Signed)
Transition Care Management Unsuccessful Follow-up Telephone Call  Date of discharge and from where:  03/06/2021, [facility].  He then returned to [facility] ED 03/07/2021 and has been discharged.   Attempts:  1st Attempt  Reason for unsuccessful TCM follow-up call:  Unable to reach patient - phone number on file is not valid. Call placed to daughter, Otelia Limes who stated she does not know how to reach him.  Patient his homeless.   Has appointment with Gwinda Passe, NP @ [facility] 04/08/2021.
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
## 2021-03-08 ENCOUNTER — Telehealth: Payer: Self-pay

## 2021-03-08 NOTE — Telephone Encounter (Signed)
Transition Care Management Unsuccessful Follow-up Telephone Call   Date of discharge and from where:  03/06/2021, [facility].  He then returned to [facility] ED 03/07/2021 and has been discharged.   Attempts:  2nd Attempt   Reason for unsuccessful TCM follow-up call:  Unable to reach patient - phone number on file is not valid. Patient his homeless.   Has appointment with Gwinda Passe, NP @ [facility] 04/08/2021.

## 2021-03-11 ENCOUNTER — Telehealth: Payer: Self-pay

## 2021-03-11 NOTE — Telephone Encounter (Signed)
Transition Care Management Unsuccessful Follow-up Telephone Call   Date of discharge and from where:  03/06/2021, [facility]  He then returned to [facility] ED 03/07/2021 and has been discharged.   Attempts:  3rd Attempt   Reason for unsuccessful TCM follow-up call:  Unable to reach patient - phone number on file is not valid. Patient his homeless.   Has appointment with Gwinda Passe, NP @ [facility] 04/08/2021.

## 2021-03-13 ENCOUNTER — Other Ambulatory Visit (INDEPENDENT_AMBULATORY_CARE_PROVIDER_SITE_OTHER): Payer: Self-pay | Admitting: Primary Care

## 2021-03-13 DIAGNOSIS — K219 Gastro-esophageal reflux disease without esophagitis: Secondary | ICD-10-CM

## 2021-03-13 MED ORDER — PANTOPRAZOLE SODIUM 40 MG PO TBEC
40.0000 mg | DELAYED_RELEASE_TABLET | Freq: Two times a day (BID) | ORAL | 0 refills | Status: AC
Start: 2021-03-13 — End: ?

## 2021-03-13 NOTE — Telephone Encounter (Signed)
Copied from CRM [id number]. Topic: Quick Communication - Rx Refill/Question >> Mar 13, 2021  1:08 PM Jaquita Rector A wrote: Medication: pantoprazole (PROTONIX) 40 MG tablet   Has the patient contacted their pharmacy? Yes.   (Agent: If no, request that the patient contact the pharmacy for the refill.) (Agent: If yes, when and what did the pharmacy advise?)  Preferred Pharmacy (with phone number or street name): [facility] [phone] - [place], [place] - [address] AT [address] [place]  Phone:  [phone] Fax:  [phone]     Agent: Please be advised that RX refills may take up to 3 business days. We ask that you follow-up with your pharmacy.

## 2021-03-23 DIAGNOSIS — Z1152 Encounter for screening for COVID-19: Secondary | ICD-10-CM | POA: Diagnosis not present

## 2021-03-26 DIAGNOSIS — Z20822 Contact with and (suspected) exposure to covid-19: Secondary | ICD-10-CM | POA: Diagnosis not present

## 2021-03-28 DIAGNOSIS — Z1152 Encounter for screening for COVID-19: Secondary | ICD-10-CM | POA: Diagnosis not present

## 2021-03-29 DIAGNOSIS — Z20822 Contact with and (suspected) exposure to covid-19: Secondary | ICD-10-CM | POA: Diagnosis not present

## 2021-03-30 DIAGNOSIS — C799 Secondary malignant neoplasm of unspecified site: Secondary | ICD-10-CM | POA: Diagnosis not present

## 2021-03-30 DIAGNOSIS — N3001 Acute cystitis with hematuria: Secondary | ICD-10-CM | POA: Diagnosis not present

## 2021-03-30 DIAGNOSIS — R918 Other nonspecific abnormal finding of lung field: Secondary | ICD-10-CM | POA: Diagnosis not present

## 2021-03-30 DIAGNOSIS — R21 Rash and other nonspecific skin eruption: Secondary | ICD-10-CM | POA: Diagnosis not present

## 2021-03-30 DIAGNOSIS — K8020 Calculus of gallbladder without cholecystitis without obstruction: Secondary | ICD-10-CM | POA: Diagnosis not present

## 2021-03-30 DIAGNOSIS — R911 Solitary pulmonary nodule: Secondary | ICD-10-CM | POA: Diagnosis not present

## 2021-04-02 DIAGNOSIS — K828 Other specified diseases of gallbladder: Secondary | ICD-10-CM | POA: Diagnosis not present

## 2021-04-02 DIAGNOSIS — R0682 Tachypnea, not elsewhere classified: Secondary | ICD-10-CM | POA: Diagnosis not present

## 2021-04-02 DIAGNOSIS — R160 Hepatomegaly, not elsewhere classified: Secondary | ICD-10-CM | POA: Diagnosis not present

## 2021-04-02 DIAGNOSIS — J811 Chronic pulmonary edema: Secondary | ICD-10-CM | POA: Diagnosis not present

## 2021-04-02 DIAGNOSIS — I959 Hypotension, unspecified: Secondary | ICD-10-CM | POA: Diagnosis not present

## 2021-04-02 DIAGNOSIS — R5381 Other malaise: Secondary | ICD-10-CM | POA: Diagnosis not present

## 2021-04-02 DIAGNOSIS — I517 Cardiomegaly: Secondary | ICD-10-CM | POA: Diagnosis not present

## 2021-04-02 DIAGNOSIS — R188 Other ascites: Secondary | ICD-10-CM | POA: Diagnosis not present

## 2021-04-02 DIAGNOSIS — M79604 Pain in right leg: Secondary | ICD-10-CM | POA: Diagnosis not present

## 2021-04-02 DIAGNOSIS — N179 Acute kidney failure, unspecified: Secondary | ICD-10-CM | POA: Diagnosis not present

## 2021-04-02 DIAGNOSIS — R109 Unspecified abdominal pain: Secondary | ICD-10-CM | POA: Diagnosis not present

## 2021-04-02 DIAGNOSIS — D72819 Decreased white blood cell count, unspecified: Secondary | ICD-10-CM | POA: Diagnosis not present

## 2021-04-02 DIAGNOSIS — K7460 Unspecified cirrhosis of liver: Secondary | ICD-10-CM | POA: Diagnosis not present

## 2021-04-02 DIAGNOSIS — K7290 Hepatic failure, unspecified without coma: Secondary | ICD-10-CM | POA: Diagnosis not present

## 2021-04-02 DIAGNOSIS — E872 Acidosis: Secondary | ICD-10-CM | POA: Diagnosis not present

## 2021-04-02 DIAGNOSIS — R600 Localized edema: Secondary | ICD-10-CM | POA: Diagnosis not present

## 2021-04-02 DIAGNOSIS — Z20822 Contact with and (suspected) exposure to covid-19: Secondary | ICD-10-CM | POA: Diagnosis not present

## 2021-04-02 DIAGNOSIS — R17 Unspecified jaundice: Secondary | ICD-10-CM | POA: Diagnosis not present

## 2021-04-02 DIAGNOSIS — L03115 Cellulitis of right lower limb: Secondary | ICD-10-CM | POA: Diagnosis not present

## 2021-04-02 DIAGNOSIS — K7469 Other cirrhosis of liver: Secondary | ICD-10-CM | POA: Diagnosis not present

## 2021-04-02 DIAGNOSIS — K7689 Other specified diseases of liver: Secondary | ICD-10-CM | POA: Diagnosis not present

## 2021-04-02 DIAGNOSIS — R0902 Hypoxemia: Secondary | ICD-10-CM | POA: Diagnosis not present

## 2021-04-02 DIAGNOSIS — K8020 Calculus of gallbladder without cholecystitis without obstruction: Secondary | ICD-10-CM | POA: Diagnosis not present

## 2021-04-02 DIAGNOSIS — B1710 Acute hepatitis C without hepatic coma: Secondary | ICD-10-CM | POA: Diagnosis not present

## 2021-04-02 DIAGNOSIS — Z95828 Presence of other vascular implants and grafts: Secondary | ICD-10-CM | POA: Diagnosis not present

## 2021-04-02 DIAGNOSIS — R000 Tachycardia, unspecified: Secondary | ICD-10-CM | POA: Diagnosis not present

## 2021-04-02 DIAGNOSIS — J9811 Atelectasis: Secondary | ICD-10-CM | POA: Diagnosis not present

## 2021-04-02 DIAGNOSIS — R0602 Shortness of breath: Secondary | ICD-10-CM | POA: Diagnosis not present

## 2021-04-02 DIAGNOSIS — D539 Nutritional anemia, unspecified: Secondary | ICD-10-CM | POA: Diagnosis not present

## 2021-04-03 DIAGNOSIS — J9601 Acute respiratory failure with hypoxia: Secondary | ICD-10-CM | POA: Diagnosis not present

## 2021-04-03 DIAGNOSIS — K7460 Unspecified cirrhosis of liver: Secondary | ICD-10-CM | POA: Diagnosis not present

## 2021-04-03 DIAGNOSIS — Z515 Encounter for palliative care: Secondary | ICD-10-CM | POA: Diagnosis not present

## 2021-04-03 DIAGNOSIS — R6521 Severe sepsis with septic shock: Secondary | ICD-10-CM | POA: Diagnosis not present

## 2021-04-03 DIAGNOSIS — A419 Sepsis, unspecified organism: Secondary | ICD-10-CM | POA: Diagnosis not present

## 2021-04-03 DIAGNOSIS — R7989 Other specified abnormal findings of blood chemistry: Secondary | ICD-10-CM | POA: Diagnosis not present

## 2021-04-03 DIAGNOSIS — R0602 Shortness of breath: Secondary | ICD-10-CM | POA: Diagnosis not present

## 2021-04-03 DIAGNOSIS — N179 Acute kidney failure, unspecified: Secondary | ICD-10-CM | POA: Diagnosis not present

## 2021-04-03 DIAGNOSIS — L03115 Cellulitis of right lower limb: Secondary | ICD-10-CM | POA: Diagnosis not present

## 2021-04-04 ENCOUNTER — Telehealth: Payer: Self-pay | Admitting: Primary Care

## 2021-04-04 DIAGNOSIS — Z515 Encounter for palliative care: Secondary | ICD-10-CM | POA: Diagnosis not present

## 2021-04-04 DIAGNOSIS — R6521 Severe sepsis with septic shock: Secondary | ICD-10-CM | POA: Diagnosis not present

## 2021-04-04 DIAGNOSIS — R7989 Other specified abnormal findings of blood chemistry: Secondary | ICD-10-CM | POA: Diagnosis not present

## 2021-04-04 DIAGNOSIS — A419 Sepsis, unspecified organism: Secondary | ICD-10-CM | POA: Diagnosis not present

## 2021-04-04 DIAGNOSIS — K7460 Unspecified cirrhosis of liver: Secondary | ICD-10-CM | POA: Diagnosis not present

## 2021-04-04 DIAGNOSIS — L03115 Cellulitis of right lower limb: Secondary | ICD-10-CM | POA: Diagnosis not present

## 2021-04-04 DIAGNOSIS — K7290 Hepatic failure, unspecified without coma: Secondary | ICD-10-CM | POA: Diagnosis not present

## 2021-04-04 DIAGNOSIS — J9601 Acute respiratory failure with hypoxia: Secondary | ICD-10-CM | POA: Diagnosis not present

## 2021-04-04 DIAGNOSIS — N179 Acute kidney failure, unspecified: Secondary | ICD-10-CM | POA: Diagnosis not present

## 2021-04-04 NOTE — Telephone Encounter (Signed)
Copied from CRM [id number]. Topic: General - Other >> Apr 04, 2021  9:42 AM Glean Salen wrote: Reason for EHO:[id number], nurse case manager, [facility], called to say patient was admitted to [facility] on 07/06. Phone number for her is [phone]

## 2021-04-04 NOTE — Telephone Encounter (Signed)
Contact for further information.

## 2021-04-05 DIAGNOSIS — R319 Hematuria, unspecified: Secondary | ICD-10-CM | POA: Diagnosis not present

## 2021-04-05 DIAGNOSIS — R6521 Severe sepsis with septic shock: Secondary | ICD-10-CM | POA: Diagnosis not present

## 2021-04-05 DIAGNOSIS — K7290 Hepatic failure, unspecified without coma: Secondary | ICD-10-CM | POA: Diagnosis not present

## 2021-04-05 DIAGNOSIS — Z515 Encounter for palliative care: Secondary | ICD-10-CM | POA: Diagnosis not present

## 2021-04-05 DIAGNOSIS — N179 Acute kidney failure, unspecified: Secondary | ICD-10-CM | POA: Diagnosis not present

## 2021-04-05 DIAGNOSIS — R7989 Other specified abnormal findings of blood chemistry: Secondary | ICD-10-CM | POA: Diagnosis not present

## 2021-04-05 DIAGNOSIS — J9601 Acute respiratory failure with hypoxia: Secondary | ICD-10-CM | POA: Diagnosis not present

## 2021-04-05 DIAGNOSIS — A419 Sepsis, unspecified organism: Secondary | ICD-10-CM | POA: Diagnosis not present

## 2021-04-05 DIAGNOSIS — A401 Sepsis due to streptococcus, group B: Secondary | ICD-10-CM | POA: Diagnosis not present

## 2021-04-05 DIAGNOSIS — B1920 Unspecified viral hepatitis C without hepatic coma: Secondary | ICD-10-CM | POA: Diagnosis not present

## 2021-04-05 DIAGNOSIS — K7460 Unspecified cirrhosis of liver: Secondary | ICD-10-CM | POA: Diagnosis not present

## 2021-04-05 DIAGNOSIS — L03115 Cellulitis of right lower limb: Secondary | ICD-10-CM | POA: Diagnosis not present

## 2021-04-06 DIAGNOSIS — R6521 Severe sepsis with septic shock: Secondary | ICD-10-CM | POA: Diagnosis not present

## 2021-04-06 DIAGNOSIS — K7460 Unspecified cirrhosis of liver: Secondary | ICD-10-CM | POA: Diagnosis not present

## 2021-04-06 DIAGNOSIS — R319 Hematuria, unspecified: Secondary | ICD-10-CM | POA: Diagnosis not present

## 2021-04-06 DIAGNOSIS — L03115 Cellulitis of right lower limb: Secondary | ICD-10-CM | POA: Diagnosis not present

## 2021-04-06 DIAGNOSIS — N179 Acute kidney failure, unspecified: Secondary | ICD-10-CM | POA: Diagnosis not present

## 2021-04-06 DIAGNOSIS — A419 Sepsis, unspecified organism: Secondary | ICD-10-CM | POA: Diagnosis not present

## 2021-04-06 DIAGNOSIS — B1920 Unspecified viral hepatitis C without hepatic coma: Secondary | ICD-10-CM | POA: Diagnosis not present

## 2021-04-07 DIAGNOSIS — R319 Hematuria, unspecified: Secondary | ICD-10-CM | POA: Diagnosis not present

## 2021-04-07 DIAGNOSIS — A419 Sepsis, unspecified organism: Secondary | ICD-10-CM | POA: Diagnosis not present

## 2021-04-07 DIAGNOSIS — R6521 Severe sepsis with septic shock: Secondary | ICD-10-CM | POA: Diagnosis not present

## 2021-04-07 DIAGNOSIS — L03115 Cellulitis of right lower limb: Secondary | ICD-10-CM | POA: Diagnosis not present

## 2021-04-07 DIAGNOSIS — B1920 Unspecified viral hepatitis C without hepatic coma: Secondary | ICD-10-CM | POA: Diagnosis not present

## 2021-04-07 DIAGNOSIS — N179 Acute kidney failure, unspecified: Secondary | ICD-10-CM | POA: Diagnosis not present

## 2021-04-07 DIAGNOSIS — K7460 Unspecified cirrhosis of liver: Secondary | ICD-10-CM | POA: Diagnosis not present

## 2021-04-08 ENCOUNTER — Inpatient Hospital Stay (INDEPENDENT_AMBULATORY_CARE_PROVIDER_SITE_OTHER): Payer: Medicaid Other | Admitting: Primary Care

## 2021-04-08 DIAGNOSIS — K7460 Unspecified cirrhosis of liver: Secondary | ICD-10-CM | POA: Diagnosis not present

## 2021-04-08 DIAGNOSIS — A401 Sepsis due to streptococcus, group B: Secondary | ICD-10-CM | POA: Diagnosis not present

## 2021-04-08 DIAGNOSIS — L03115 Cellulitis of right lower limb: Secondary | ICD-10-CM | POA: Diagnosis not present

## 2021-04-09 DIAGNOSIS — L03115 Cellulitis of right lower limb: Secondary | ICD-10-CM | POA: Diagnosis not present

## 2021-04-09 DIAGNOSIS — Z452 Encounter for adjustment and management of vascular access device: Secondary | ICD-10-CM | POA: Diagnosis not present

## 2021-04-09 DIAGNOSIS — K7460 Unspecified cirrhosis of liver: Secondary | ICD-10-CM | POA: Diagnosis not present

## 2021-04-09 DIAGNOSIS — A401 Sepsis due to streptococcus, group B: Secondary | ICD-10-CM | POA: Diagnosis not present

## 2021-04-10 DIAGNOSIS — L03115 Cellulitis of right lower limb: Secondary | ICD-10-CM | POA: Diagnosis not present

## 2021-04-10 DIAGNOSIS — A401 Sepsis due to streptococcus, group B: Secondary | ICD-10-CM | POA: Diagnosis not present

## 2021-04-10 DIAGNOSIS — K7460 Unspecified cirrhosis of liver: Secondary | ICD-10-CM | POA: Diagnosis not present

## 2021-04-23 ENCOUNTER — Telehealth (INDEPENDENT_AMBULATORY_CARE_PROVIDER_SITE_OTHER): Payer: Medicaid Other | Admitting: Primary Care

## 2021-04-29 DEATH — deceased

## 2021-10-23 ENCOUNTER — Other Ambulatory Visit: Payer: Self-pay

## 2022-06-05 IMAGING — CT CT HEAD W/O CM
3 series · 16 of 47 positions shown, 19 images · non-contrast
Comparison: None.

CLINICAL DATA: Forgetfulness and confusion since yesterday, altered
mental status

EXAM:
CT HEAD WITHOUT CONTRAST
TECHNIQUE: Contiguous axial images were obtained from the base of the skull
through the vertex without intravenous contrast.

[Series 3: head 5.0 h30s · axial · 0.42mm/px · z∈[-89,+56]mm · 10 of 35 slices shown, 13 images]
[im 3/35  brain]
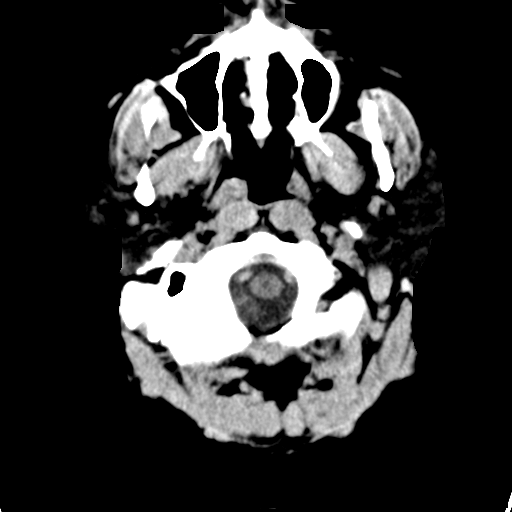
[im 3/35  bone]
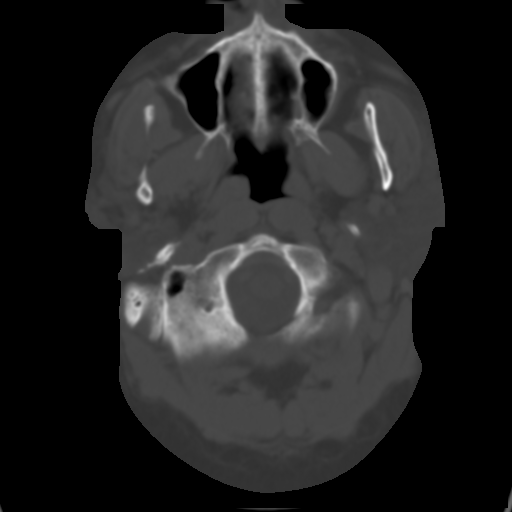
[im 6/35  brain]
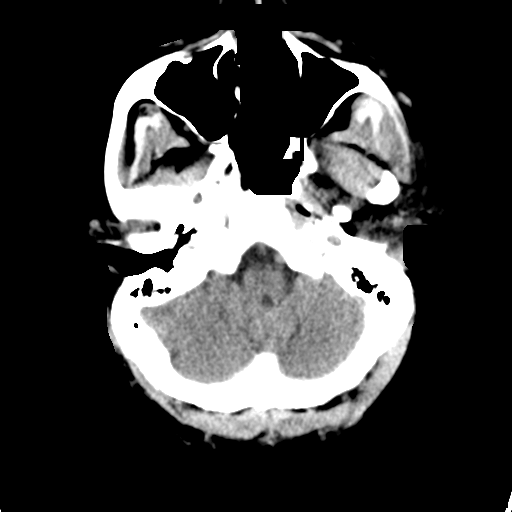
[im 10/35  brain]
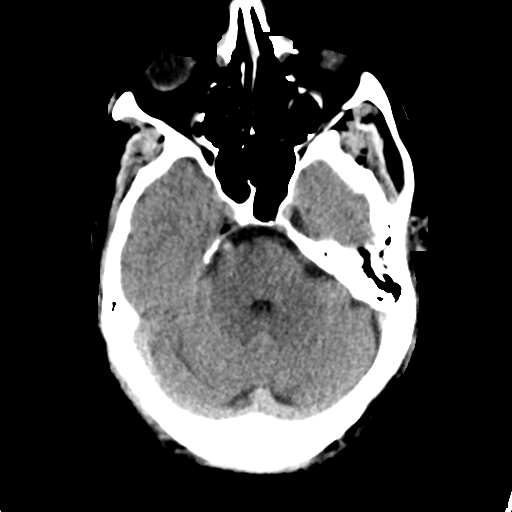
[im 12/35  brain]
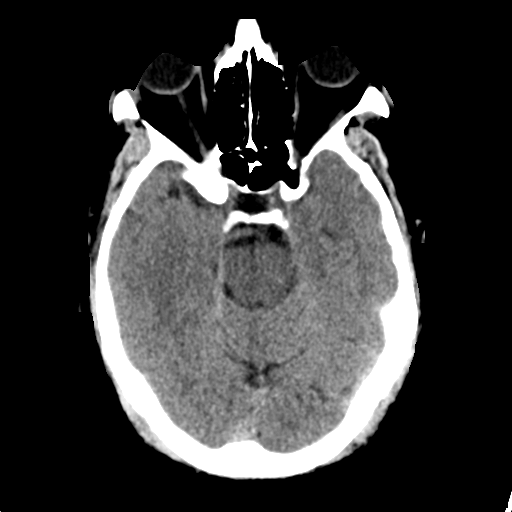
[im 16/35  brain]
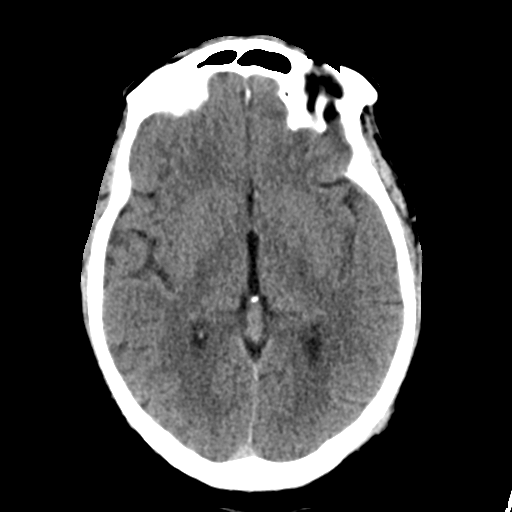
[im 16/35  bone]
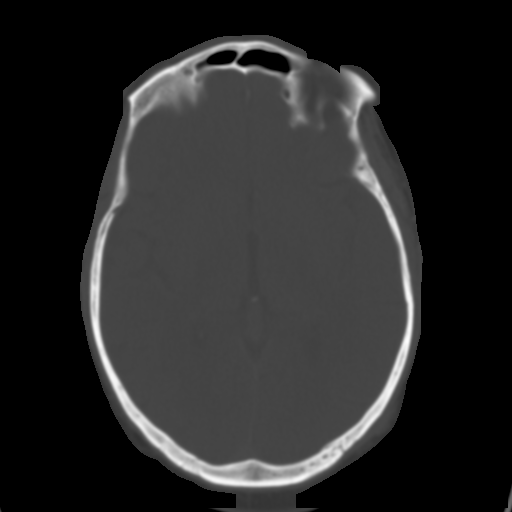
[im 19/35  brain]
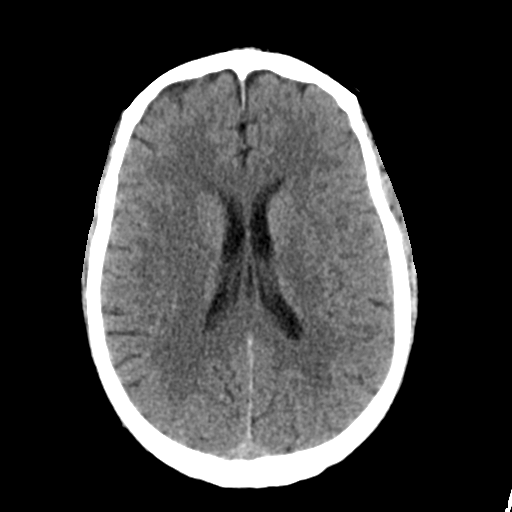
[im 23/35  brain]
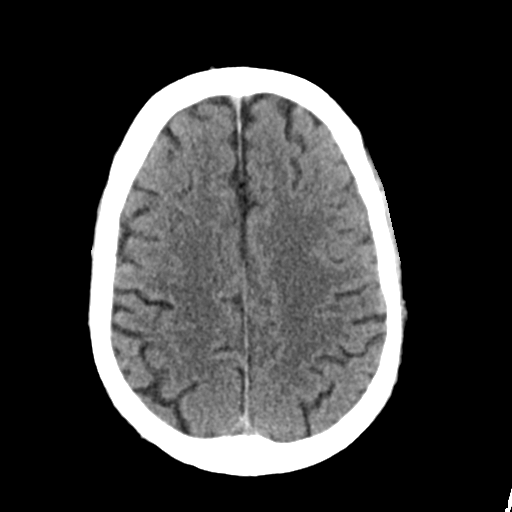
[im 26/35  brain]
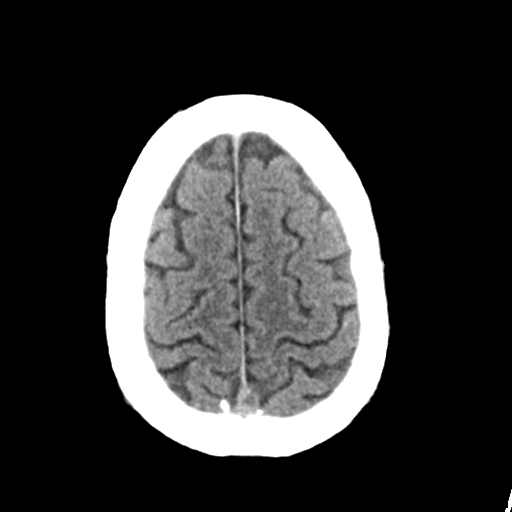
[im 29/35  brain]
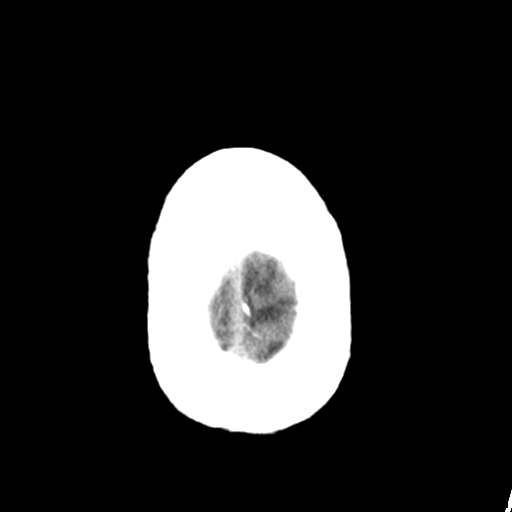
[im 29/35  bone]
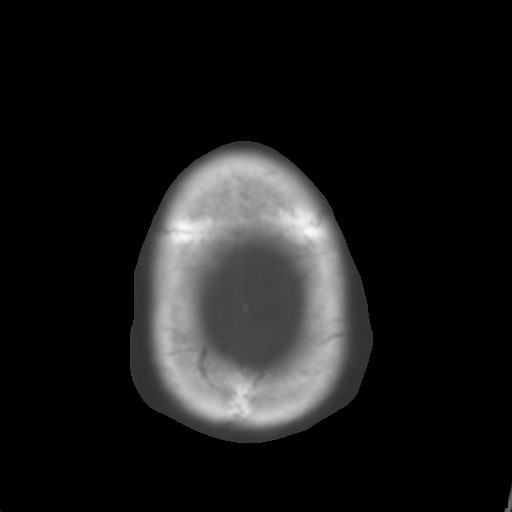
[im 32/35  brain]
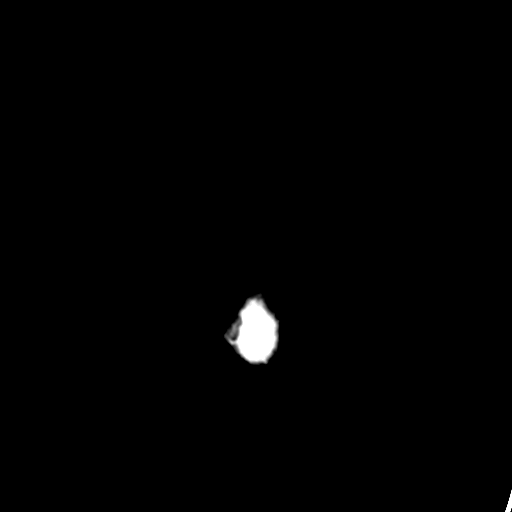

[Series 5: head 3.0 mpr cor · coronal · 0.32mm/px · 3 of 68 slices shown]
[im 23/68  brain]
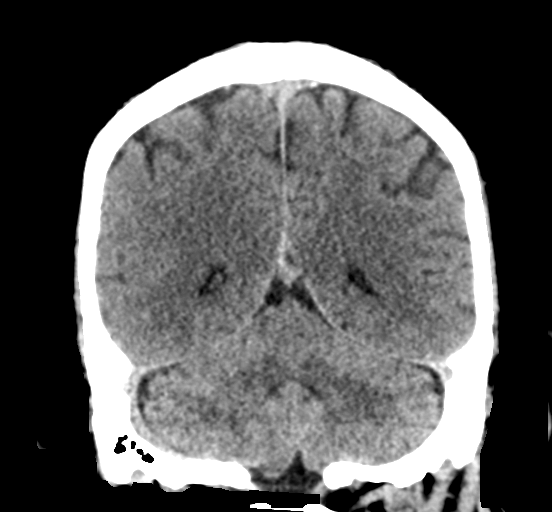
[im 30/68  brain]
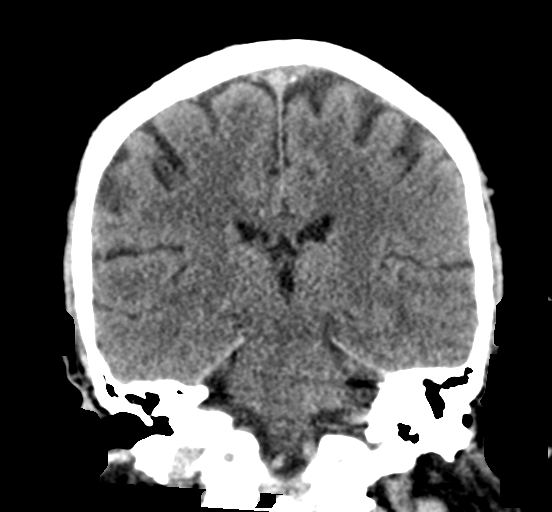
[im 38/68  brain]
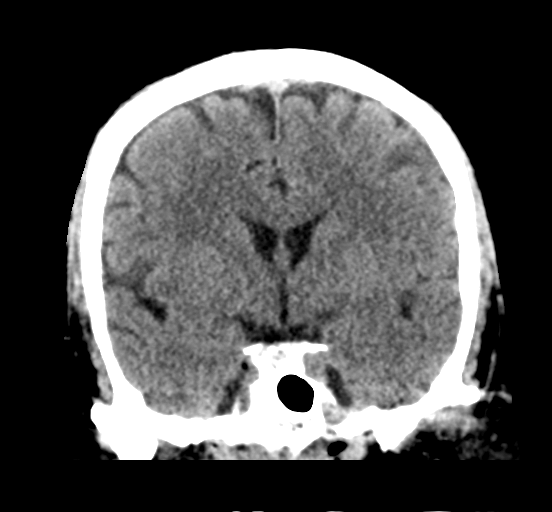

[Series 6: head 3.0 mpr sag · sagittal · 0.33mm/px · 3 of 54 slices shown]
[im 18/54  brain]
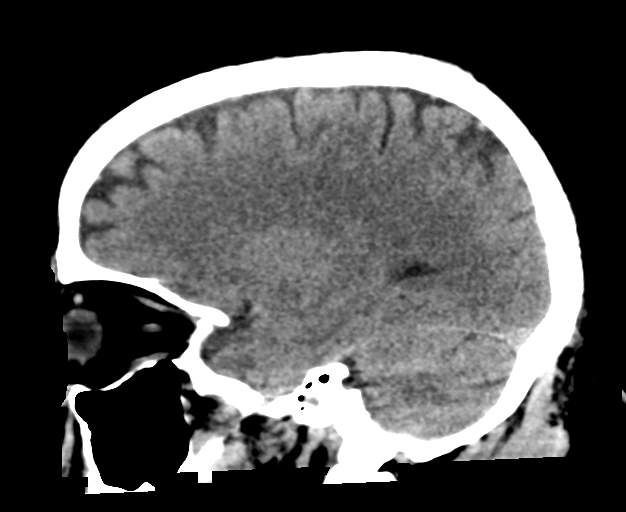
[im 27/54  brain]
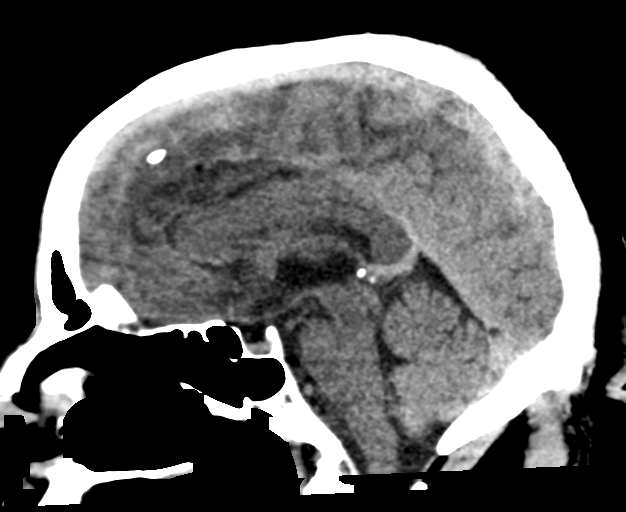
[im 36/54  brain]
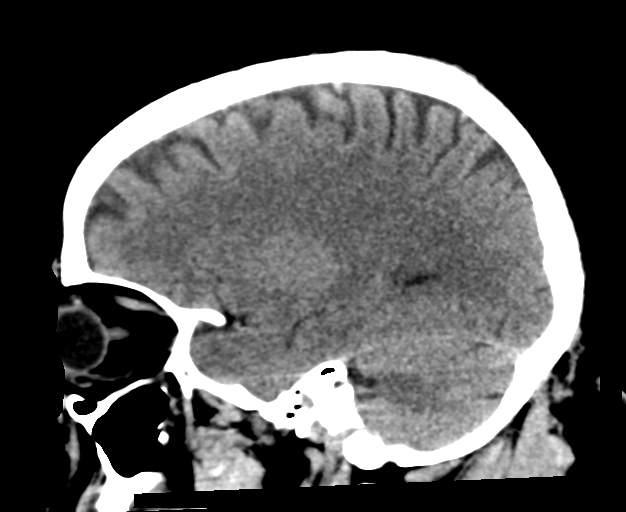

[16 of 47 positions shown; findings below may reference images not displayed]

FINDINGS: Brain: No evidence of acute infarction, hemorrhage, hydrocephalus,
extra-axial collection or mass lesion/mass effect.

Vascular: No hyperdense vessel or unexpected calcification.

Skull: Normal. Negative for fracture or focal lesion.

Sinuses/Orbits: No acute finding.

Other: None.
IMPRESSION: No acute intracranial pathology.

## 2022-09-09 IMAGING — DX DG CHEST 1V PORT
1 series · 1 of 1 positions shown · non-contrast
Comparison: Chest x-ray 11/01/2020

CLINICAL DATA: Shortness of breath.

EXAM:
PORTABLE CHEST 1 VIEW

[chest]
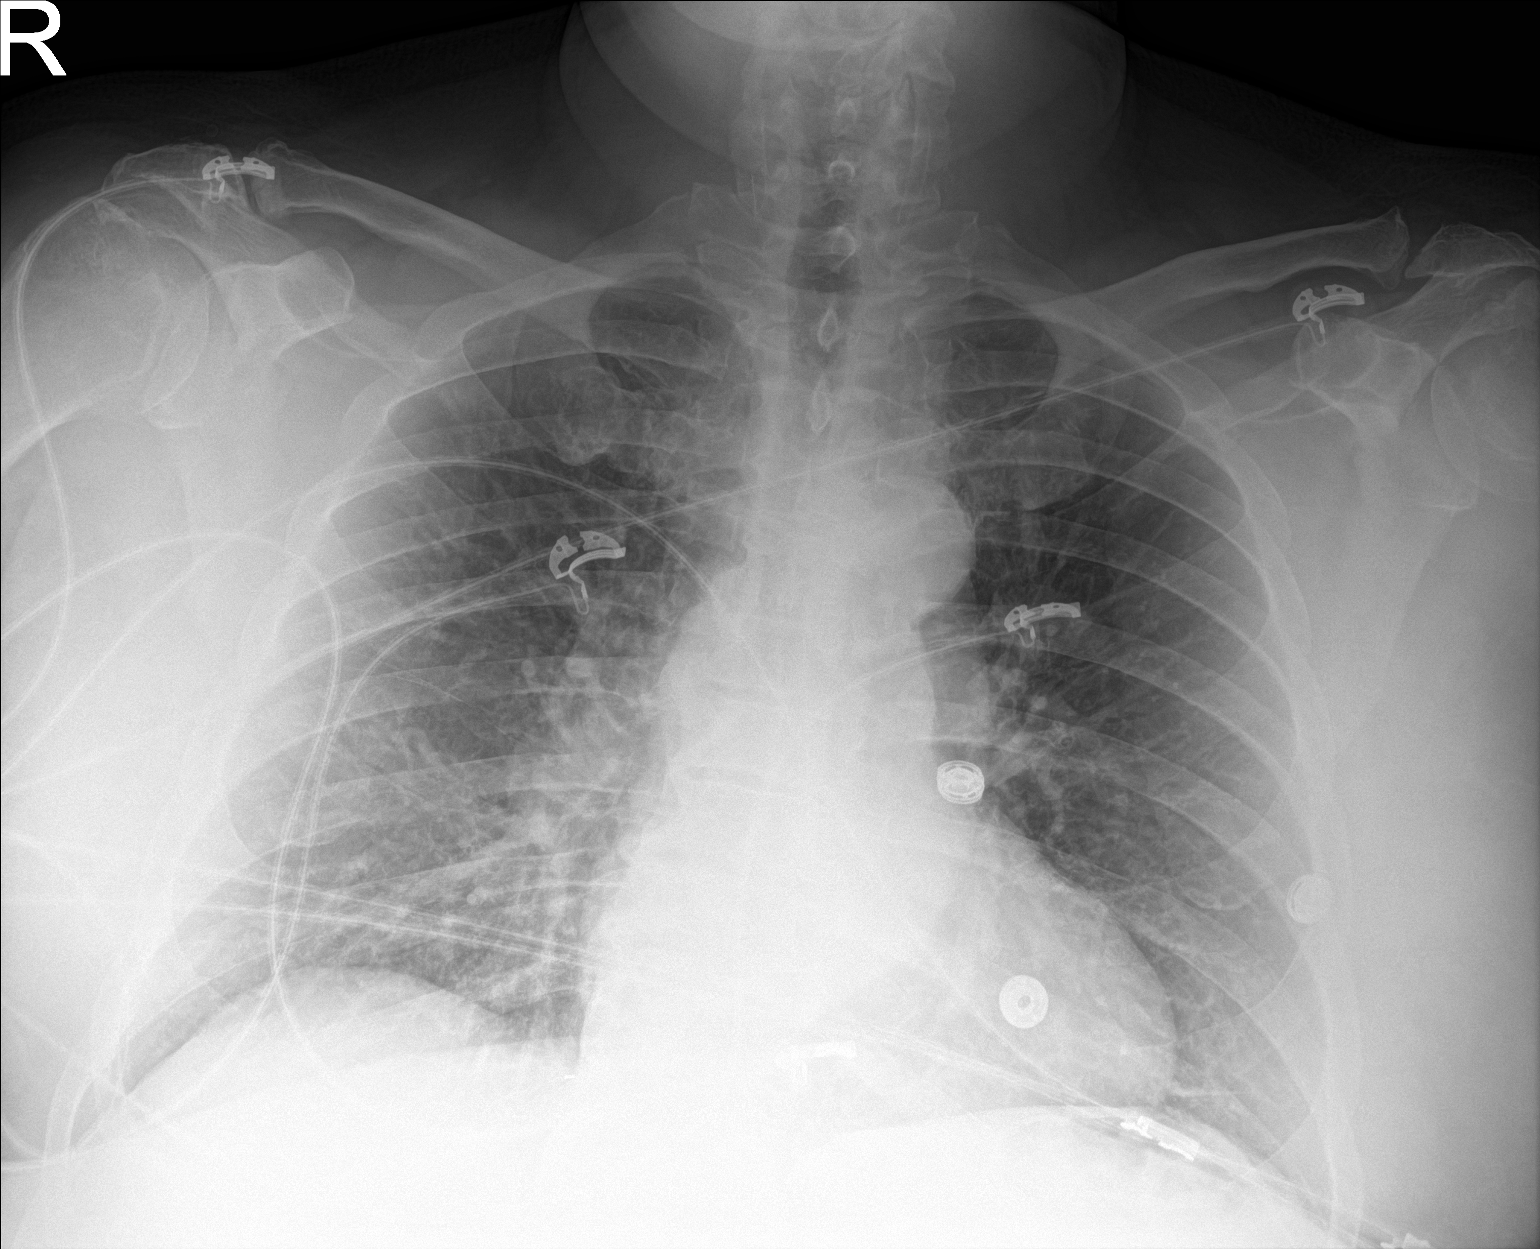

[1 of 1 positions shown; findings below may reference images not displayed]

FINDINGS: The heart size and mediastinal contours are unchanged. Slightly
prominent hilar vasculature as prior.

No focal consolidation. No pulmonary edema. No pleural effusion. No
pneumothorax.

No acute osseous abnormality.
IMPRESSION: No active disease.

## 2022-10-07 IMAGING — US US RENAL
1 series · 14 of 25 positions shown · non-contrast
Comparison: No recent prior.

CLINICAL DATA: Acute renal insufficiency.

EXAM:
RENAL / URINARY TRACT ULTRASOUND COMPLETE

[Series 1: us renal · 14 of 36 slices shown]
[im 1/36]
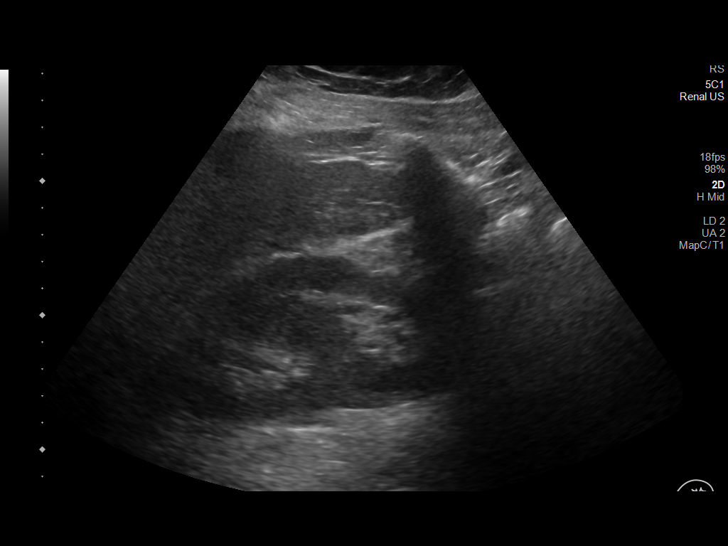
[im 3/36]
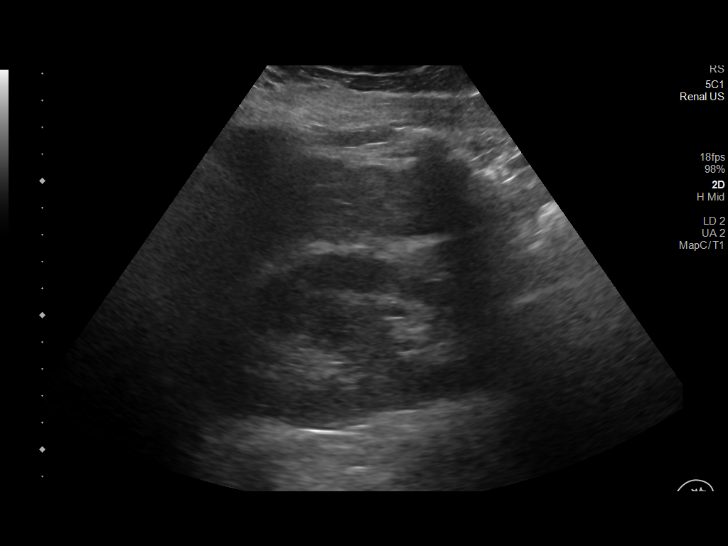
[im 6/36]
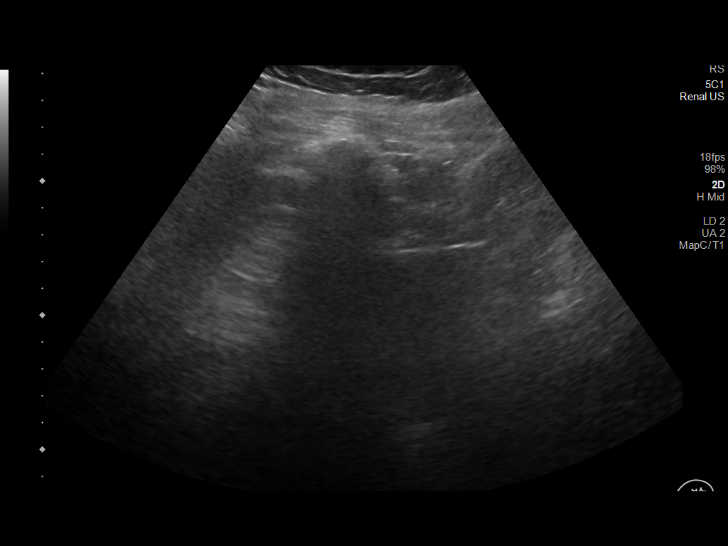
[im 9/36]
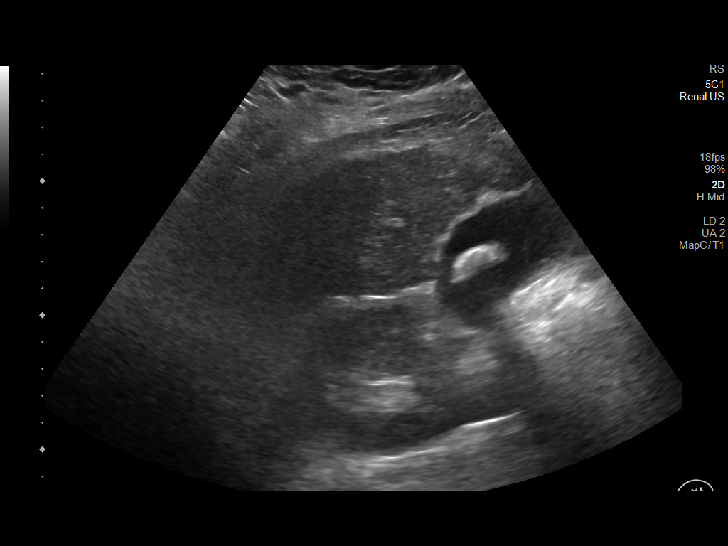
[im 12/36]
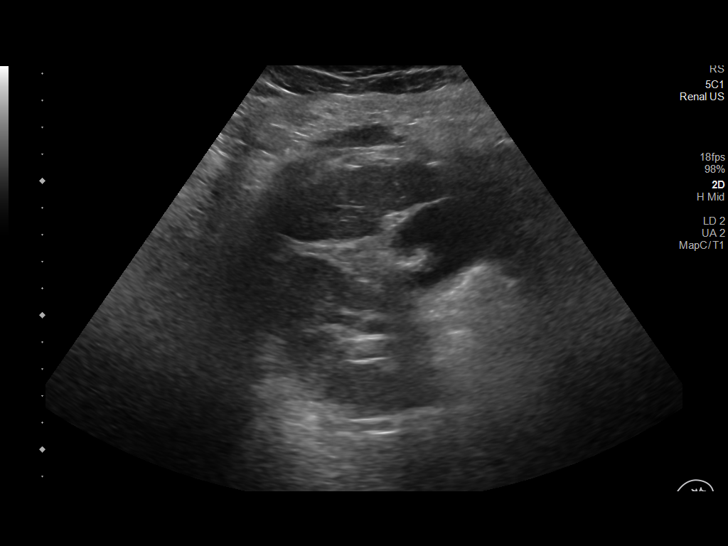
[im 14/36]
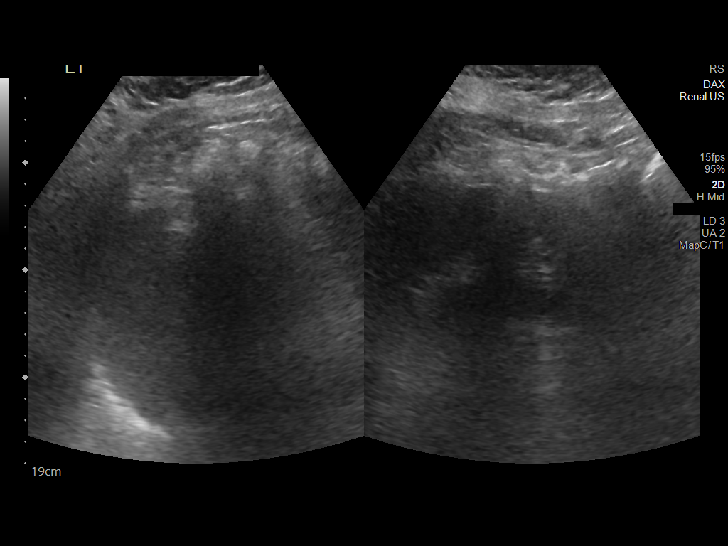
[im 17/36]
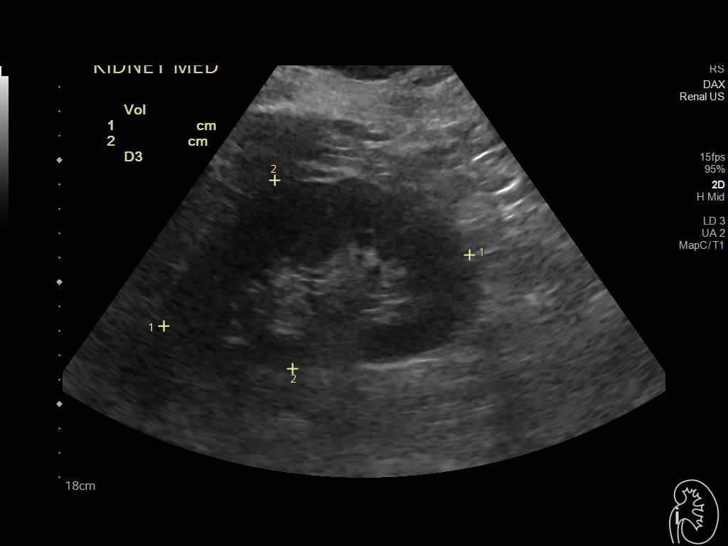
[im 19/36]
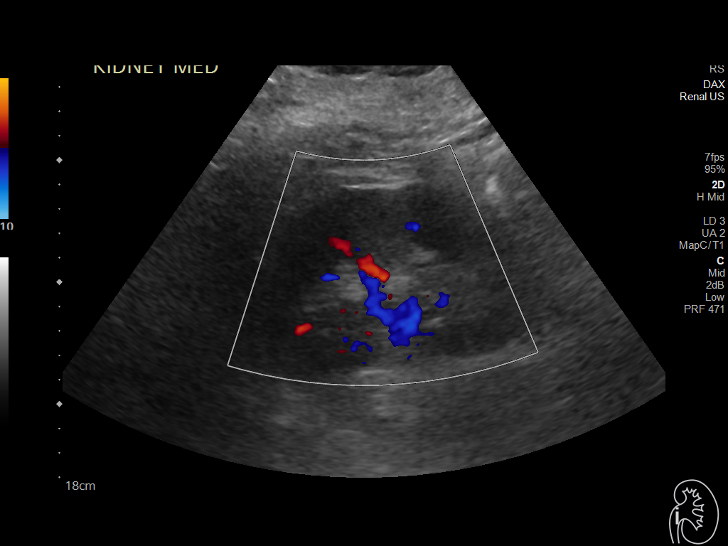
[im 22/36]
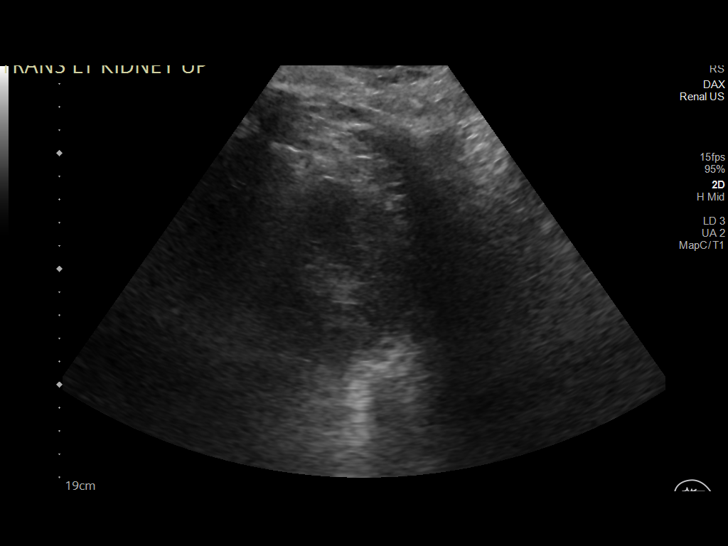
[im 24/36]
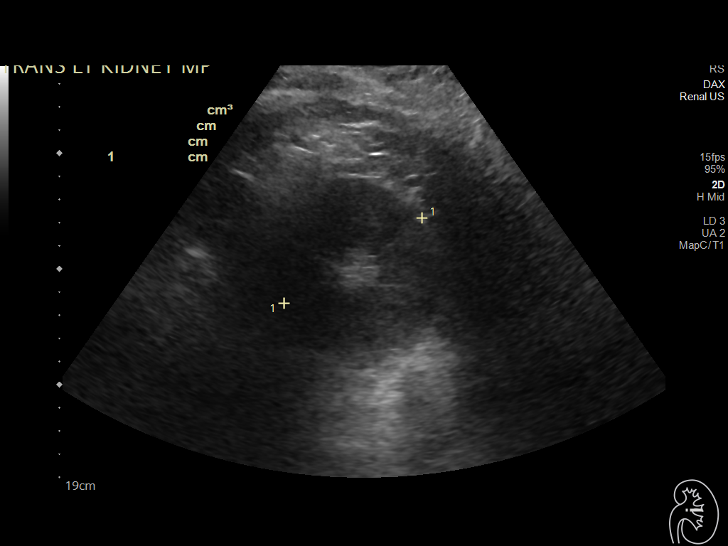
[im 27/36]
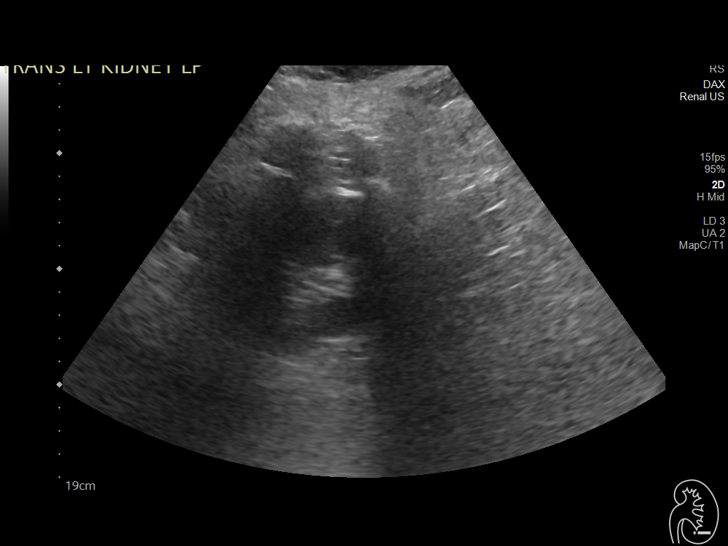
[im 30/36]
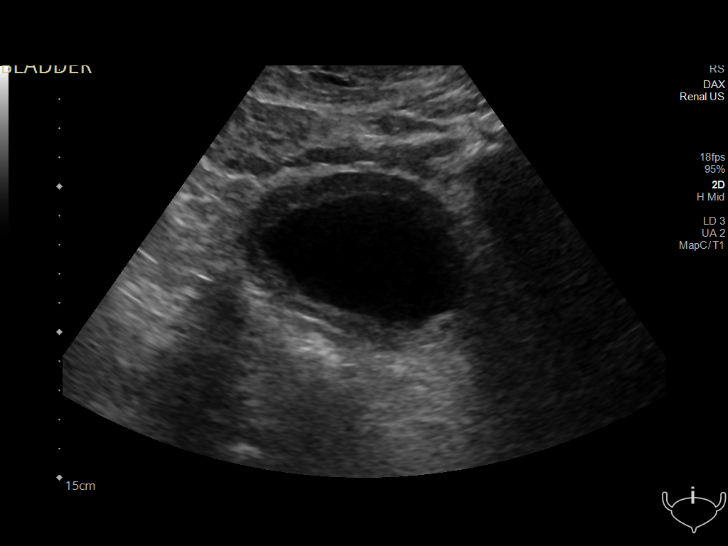
[im 33/36]
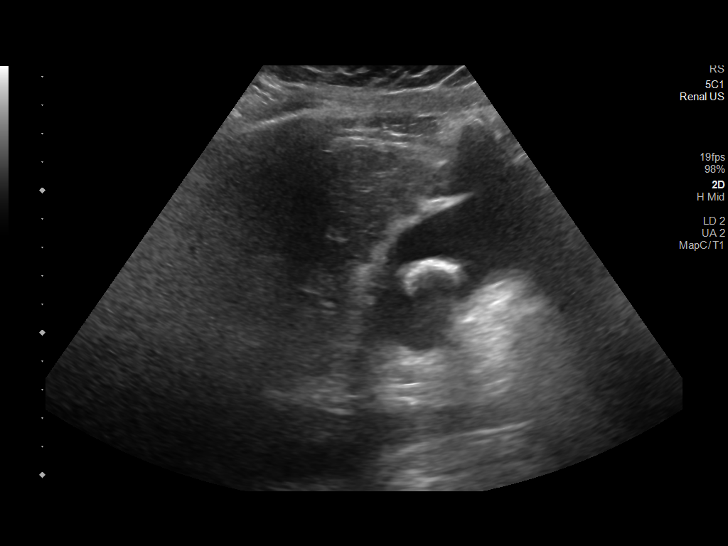
[im 36/36]
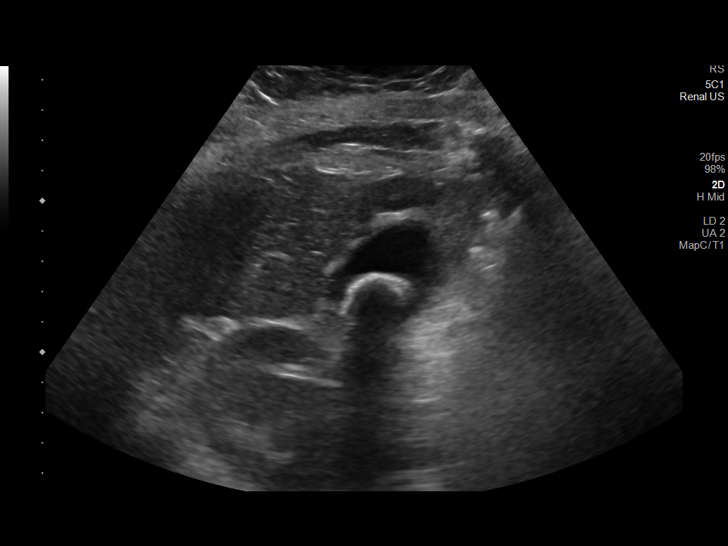

[14 of 25 positions shown; findings below may reference images not displayed]

FINDINGS: Right Kidney:

Renal measurements: 11.9 x 6.3 x 4.9 cm = volume: 195 mL.
Echogenicity within normal limits. No mass or hydronephrosis
visualized. Possible duplication right renal collecting system.

Left Kidney:

Renal measurements: 12.9 x 7.8 x 7.0 cm = volume: 366.6 mL.
Echogenicity within normal limits. No mass or hydronephrosis
visualized.

Bladder:

Bladder nondistended.

Other: Gallstone incidentally noted. Limited exam due to patient's
body habitus.
IMPRESSION: 1. Possible duplication right renal collecting system. No acute or
focal renal abnormality. No hydronephrosis. Bladder nondistended.

2.  Gallstone incidentally noted.

## 2022-10-07 IMAGING — DX DG CHEST 2V
2 series · 2 of 2 positions shown · non-contrast
Comparison: 02/05/2021

CLINICAL DATA: Chest pain

EXAM:
CHEST - 2 VIEW

[chest lat]
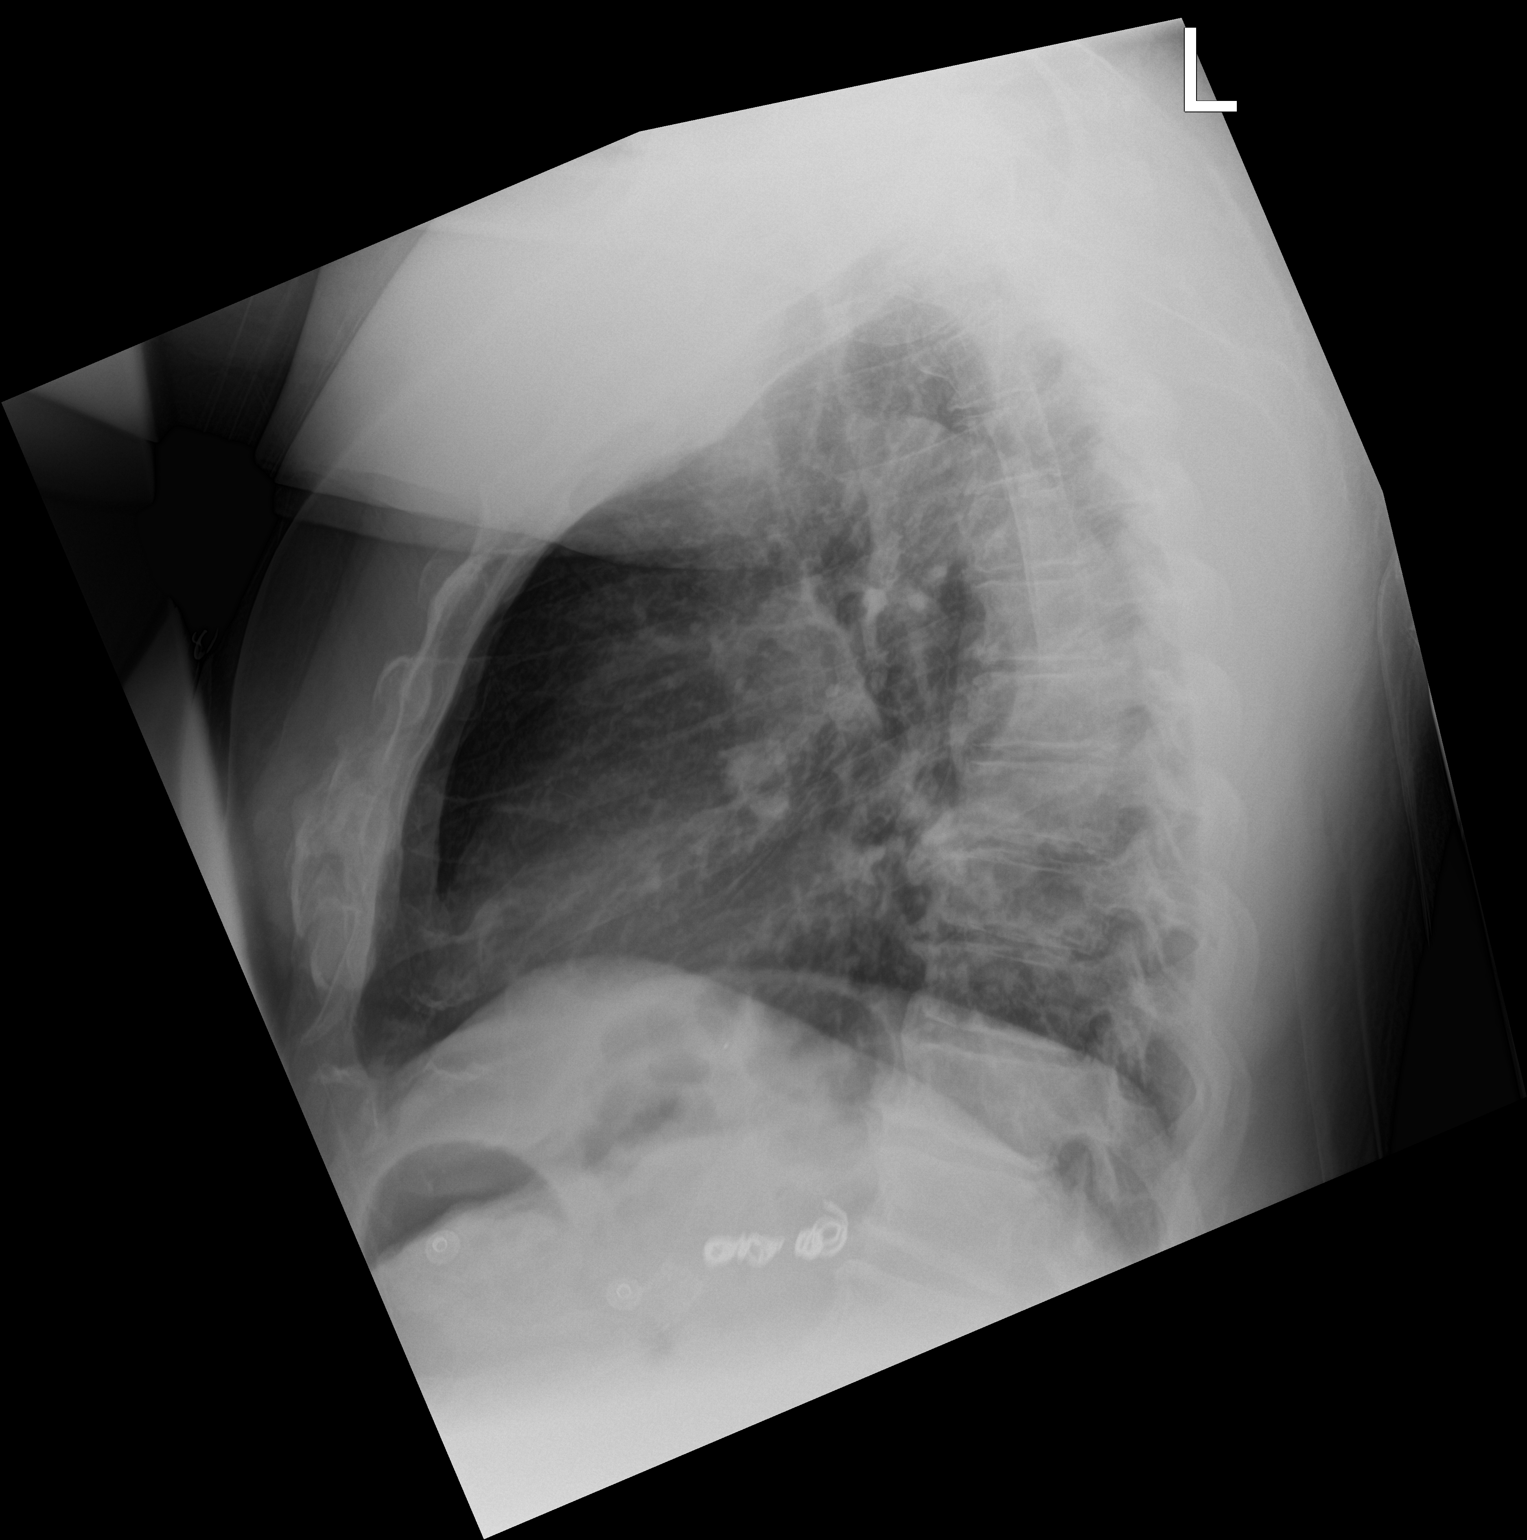

[chest ap]
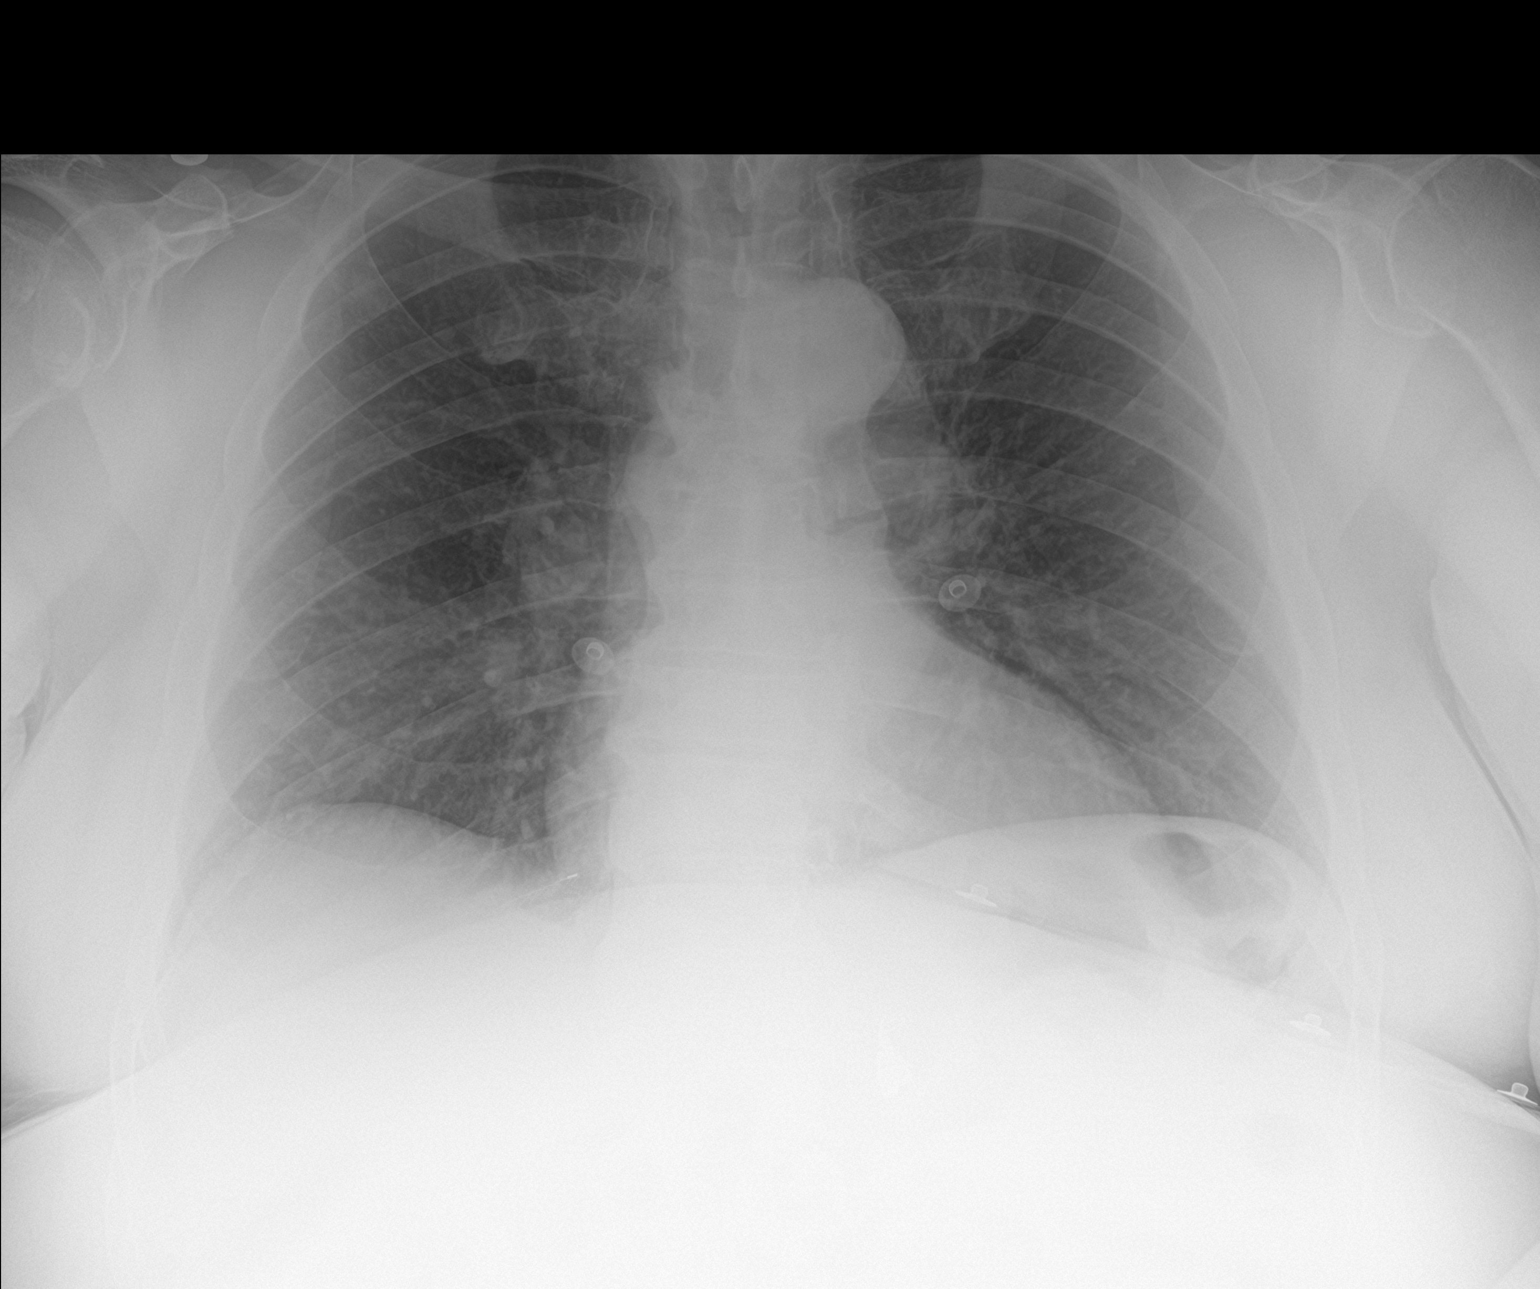

[2 of 2 positions shown; findings below may reference images not displayed]

FINDINGS: Lungs are well expanded, symmetric, and clear. No pneumothorax or
pleural effusion. Cardiac size within normal limits. Pulmonary
vascularity is normal. Osseous structures are age-appropriate. No
acute bone abnormality.
IMPRESSION: No active cardiopulmonary disease.
